# Patient Record
Sex: Female | Born: 1945 | Race: Black or African American | Hispanic: No | Marital: Married | Smoking: Never smoker
Health system: Southern US, Community
[De-identification: ages and names within clinical notes are randomized; demographics above are authoritative.]

## PROBLEM LIST (undated history)

## (undated) DIAGNOSIS — E119 Type 2 diabetes mellitus without complications: Secondary | ICD-10-CM

## (undated) HISTORY — PX: ABDOMINAL HYSTERECTOMY: SHX81

---

## 2013-07-24 ENCOUNTER — Other Ambulatory Visit: Payer: Self-pay | Admitting: Internal Medicine

## 2013-07-24 DIAGNOSIS — Z1231 Encounter for screening mammogram for malignant neoplasm of breast: Secondary | ICD-10-CM

## 2013-08-21 ENCOUNTER — Ambulatory Visit
Admission: RE | Admit: 2013-08-21 | Discharge: 2013-08-21 | Disposition: A | Discharge status: Home / Self Care | Payer: Medicare Other | Source: Ambulatory Visit | Attending: Internal Medicine | Admitting: Internal Medicine

## 2013-08-21 DIAGNOSIS — Z1231 Encounter for screening mammogram for malignant neoplasm of breast: Secondary | ICD-10-CM

## 2013-08-23 ENCOUNTER — Other Ambulatory Visit: Payer: Self-pay | Admitting: Internal Medicine

## 2013-08-23 DIAGNOSIS — R928 Other abnormal and inconclusive findings on diagnostic imaging of breast: Secondary | ICD-10-CM

## 2013-09-04 ENCOUNTER — Ambulatory Visit
Admission: RE | Admit: 2013-09-04 | Discharge: 2013-09-04 | Disposition: A | Discharge status: Home / Self Care | Payer: Medicare Other | Source: Ambulatory Visit | Attending: Internal Medicine | Admitting: Internal Medicine

## 2013-09-04 DIAGNOSIS — R928 Other abnormal and inconclusive findings on diagnostic imaging of breast: Secondary | ICD-10-CM

## 2013-09-09 ENCOUNTER — Ambulatory Visit
Admission: RE | Admit: 2013-09-09 | Discharge: 2013-09-09 | Disposition: A | Discharge status: Home / Self Care | Payer: Medicare Other | Source: Ambulatory Visit | Attending: Internal Medicine | Admitting: Internal Medicine

## 2013-09-09 ENCOUNTER — Other Ambulatory Visit: Payer: Self-pay | Admitting: Internal Medicine

## 2013-09-09 DIAGNOSIS — R079 Chest pain, unspecified: Secondary | ICD-10-CM

## 2014-03-31 ENCOUNTER — Other Ambulatory Visit: Payer: Self-pay | Admitting: Internal Medicine

## 2014-03-31 DIAGNOSIS — R51 Headache: Secondary | ICD-10-CM

## 2014-03-31 DIAGNOSIS — R42 Dizziness and giddiness: Secondary | ICD-10-CM

## 2014-04-05 ENCOUNTER — Ambulatory Visit
Admission: RE | Admit: 2014-04-05 | Discharge: 2014-04-05 | Disposition: A | Discharge status: Home / Self Care | Payer: Medicare Other | Source: Ambulatory Visit | Attending: Internal Medicine | Admitting: Internal Medicine

## 2014-04-05 DIAGNOSIS — R51 Headache: Secondary | ICD-10-CM

## 2014-04-05 DIAGNOSIS — R42 Dizziness and giddiness: Secondary | ICD-10-CM

## 2014-09-05 DIAGNOSIS — H6590 Unspecified nonsuppurative otitis media, unspecified ear: Secondary | ICD-10-CM | POA: Diagnosis not present

## 2014-09-05 DIAGNOSIS — H8110 Benign paroxysmal vertigo, unspecified ear: Secondary | ICD-10-CM | POA: Diagnosis not present

## 2014-09-19 DIAGNOSIS — E139 Other specified diabetes mellitus without complications: Secondary | ICD-10-CM | POA: Diagnosis not present

## 2015-01-27 DIAGNOSIS — M542 Cervicalgia: Secondary | ICD-10-CM | POA: Diagnosis not present

## 2015-04-01 DIAGNOSIS — I10 Essential (primary) hypertension: Secondary | ICD-10-CM | POA: Diagnosis not present

## 2015-04-01 DIAGNOSIS — Z0000 Encounter for general adult medical examination without abnormal findings: Secondary | ICD-10-CM | POA: Diagnosis not present

## 2015-04-01 DIAGNOSIS — E1165 Type 2 diabetes mellitus with hyperglycemia: Secondary | ICD-10-CM | POA: Diagnosis not present

## 2015-04-01 DIAGNOSIS — Z23 Encounter for immunization: Secondary | ICD-10-CM | POA: Diagnosis not present

## 2015-04-01 DIAGNOSIS — M109 Gout, unspecified: Secondary | ICD-10-CM | POA: Diagnosis not present

## 2015-04-01 DIAGNOSIS — Z1389 Encounter for screening for other disorder: Secondary | ICD-10-CM | POA: Diagnosis not present

## 2015-04-14 DIAGNOSIS — Z1211 Encounter for screening for malignant neoplasm of colon: Secondary | ICD-10-CM | POA: Diagnosis not present

## 2015-06-12 DIAGNOSIS — Z23 Encounter for immunization: Secondary | ICD-10-CM | POA: Diagnosis not present

## 2015-06-12 DIAGNOSIS — M25552 Pain in left hip: Secondary | ICD-10-CM | POA: Diagnosis not present

## 2015-08-21 DIAGNOSIS — M25552 Pain in left hip: Secondary | ICD-10-CM | POA: Diagnosis not present

## 2015-08-24 ENCOUNTER — Other Ambulatory Visit: Payer: Self-pay | Admitting: Internal Medicine

## 2015-08-24 ENCOUNTER — Ambulatory Visit
Admission: RE | Admit: 2015-08-24 | Discharge: 2015-08-24 | Disposition: A | Discharge status: Home / Self Care | Payer: Commercial Managed Care - HMO | Source: Ambulatory Visit | Attending: Internal Medicine | Admitting: Internal Medicine

## 2015-08-24 DIAGNOSIS — M25552 Pain in left hip: Secondary | ICD-10-CM

## 2015-08-24 DIAGNOSIS — M1612 Unilateral primary osteoarthritis, left hip: Secondary | ICD-10-CM | POA: Diagnosis not present

## 2015-09-04 DIAGNOSIS — M545 Low back pain: Secondary | ICD-10-CM | POA: Diagnosis not present

## 2015-09-04 DIAGNOSIS — M25552 Pain in left hip: Secondary | ICD-10-CM | POA: Diagnosis not present

## 2015-10-02 DIAGNOSIS — R920 Mammographic microcalcification found on diagnostic imaging of breast: Secondary | ICD-10-CM | POA: Diagnosis not present

## 2015-10-02 DIAGNOSIS — I10 Essential (primary) hypertension: Secondary | ICD-10-CM | POA: Diagnosis not present

## 2015-10-02 DIAGNOSIS — Z7984 Long term (current) use of oral hypoglycemic drugs: Secondary | ICD-10-CM | POA: Diagnosis not present

## 2015-10-02 DIAGNOSIS — E1165 Type 2 diabetes mellitus with hyperglycemia: Secondary | ICD-10-CM | POA: Diagnosis not present

## 2015-10-02 DIAGNOSIS — M109 Gout, unspecified: Secondary | ICD-10-CM | POA: Diagnosis not present

## 2015-10-23 DIAGNOSIS — H5213 Myopia, bilateral: Secondary | ICD-10-CM | POA: Diagnosis not present

## 2015-10-23 DIAGNOSIS — H18419 Arcus senilis, unspecified eye: Secondary | ICD-10-CM | POA: Diagnosis not present

## 2015-10-23 DIAGNOSIS — Z0100 Encounter for examination of eyes and vision without abnormal findings: Secondary | ICD-10-CM | POA: Diagnosis not present

## 2015-10-23 DIAGNOSIS — H5210 Myopia, unspecified eye: Secondary | ICD-10-CM | POA: Diagnosis not present

## 2015-10-23 DIAGNOSIS — E119 Type 2 diabetes mellitus without complications: Secondary | ICD-10-CM | POA: Diagnosis not present

## 2015-10-23 DIAGNOSIS — H52223 Regular astigmatism, bilateral: Secondary | ICD-10-CM | POA: Diagnosis not present

## 2015-10-23 DIAGNOSIS — H524 Presbyopia: Secondary | ICD-10-CM | POA: Diagnosis not present

## 2015-10-23 DIAGNOSIS — Z961 Presence of intraocular lens: Secondary | ICD-10-CM | POA: Diagnosis not present

## 2016-03-16 ENCOUNTER — Other Ambulatory Visit: Payer: Self-pay | Admitting: Internal Medicine

## 2016-03-16 DIAGNOSIS — R921 Mammographic calcification found on diagnostic imaging of breast: Secondary | ICD-10-CM

## 2016-03-25 ENCOUNTER — Ambulatory Visit
Admission: RE | Admit: 2016-03-25 | Discharge: 2016-03-25 | Disposition: A | Discharge status: Home / Self Care | Payer: Commercial Managed Care - HMO | Source: Ambulatory Visit | Attending: Internal Medicine | Admitting: Internal Medicine

## 2016-03-25 DIAGNOSIS — R921 Mammographic calcification found on diagnostic imaging of breast: Secondary | ICD-10-CM | POA: Diagnosis not present

## 2016-04-01 DIAGNOSIS — Z0000 Encounter for general adult medical examination without abnormal findings: Secondary | ICD-10-CM | POA: Diagnosis not present

## 2016-04-01 DIAGNOSIS — E1165 Type 2 diabetes mellitus with hyperglycemia: Secondary | ICD-10-CM | POA: Diagnosis not present

## 2016-04-01 DIAGNOSIS — Z7189 Other specified counseling: Secondary | ICD-10-CM | POA: Diagnosis not present

## 2016-04-01 DIAGNOSIS — Z1389 Encounter for screening for other disorder: Secondary | ICD-10-CM | POA: Diagnosis not present

## 2016-04-01 DIAGNOSIS — Z7984 Long term (current) use of oral hypoglycemic drugs: Secondary | ICD-10-CM | POA: Diagnosis not present

## 2016-04-15 DIAGNOSIS — I10 Essential (primary) hypertension: Secondary | ICD-10-CM | POA: Diagnosis not present

## 2016-04-15 DIAGNOSIS — Z7984 Long term (current) use of oral hypoglycemic drugs: Secondary | ICD-10-CM | POA: Diagnosis not present

## 2016-04-15 DIAGNOSIS — Q899 Congenital malformation, unspecified: Secondary | ICD-10-CM | POA: Diagnosis not present

## 2016-04-15 DIAGNOSIS — L989 Disorder of the skin and subcutaneous tissue, unspecified: Secondary | ICD-10-CM | POA: Diagnosis not present

## 2016-04-15 DIAGNOSIS — E1165 Type 2 diabetes mellitus with hyperglycemia: Secondary | ICD-10-CM | POA: Diagnosis not present

## 2016-05-13 DIAGNOSIS — L820 Inflamed seborrheic keratosis: Secondary | ICD-10-CM | POA: Diagnosis not present

## 2016-06-15 DIAGNOSIS — Z23 Encounter for immunization: Secondary | ICD-10-CM | POA: Diagnosis not present

## 2016-09-30 DIAGNOSIS — R0789 Other chest pain: Secondary | ICD-10-CM | POA: Diagnosis not present

## 2016-10-12 DIAGNOSIS — I10 Essential (primary) hypertension: Secondary | ICD-10-CM | POA: Diagnosis not present

## 2016-10-12 DIAGNOSIS — Z7984 Long term (current) use of oral hypoglycemic drugs: Secondary | ICD-10-CM | POA: Diagnosis not present

## 2016-10-12 DIAGNOSIS — M109 Gout, unspecified: Secondary | ICD-10-CM | POA: Diagnosis not present

## 2016-10-12 DIAGNOSIS — E1165 Type 2 diabetes mellitus with hyperglycemia: Secondary | ICD-10-CM | POA: Diagnosis not present

## 2016-10-12 DIAGNOSIS — R0789 Other chest pain: Secondary | ICD-10-CM | POA: Diagnosis not present

## 2017-01-17 DIAGNOSIS — E119 Type 2 diabetes mellitus without complications: Secondary | ICD-10-CM | POA: Diagnosis not present

## 2017-01-18 DIAGNOSIS — H524 Presbyopia: Secondary | ICD-10-CM | POA: Diagnosis not present

## 2017-01-25 ENCOUNTER — Emergency Department (HOSPITAL_COMMUNITY)
Admission: EM | Admit: 2017-01-25 | Discharge: 2017-01-26 | Disposition: A | Discharge status: Home / Self Care | Payer: Medicare HMO | Attending: Emergency Medicine | Admitting: Emergency Medicine

## 2017-01-25 ENCOUNTER — Encounter (HOSPITAL_COMMUNITY): Payer: Self-pay | Admitting: *Deleted

## 2017-01-25 DIAGNOSIS — E119 Type 2 diabetes mellitus without complications: Secondary | ICD-10-CM | POA: Insufficient documentation

## 2017-01-25 DIAGNOSIS — R109 Unspecified abdominal pain: Secondary | ICD-10-CM | POA: Diagnosis not present

## 2017-01-25 DIAGNOSIS — K5732 Diverticulitis of large intestine without perforation or abscess without bleeding: Secondary | ICD-10-CM | POA: Diagnosis not present

## 2017-01-25 DIAGNOSIS — K5792 Diverticulitis of intestine, part unspecified, without perforation or abscess without bleeding: Secondary | ICD-10-CM

## 2017-01-25 DIAGNOSIS — K5790 Diverticulosis of intestine, part unspecified, without perforation or abscess without bleeding: Secondary | ICD-10-CM | POA: Insufficient documentation

## 2017-01-25 DIAGNOSIS — R1030 Lower abdominal pain, unspecified: Secondary | ICD-10-CM | POA: Diagnosis present

## 2017-01-25 HISTORY — DX: Type 2 diabetes mellitus without complications: E11.9

## 2017-01-25 LAB — COMPREHENSIVE METABOLIC PANEL
ALT: 17 U/L (ref 14–54)
AST: 20 U/L (ref 15–41)
Albumin: 3.3 g/dL — ABNORMAL LOW (ref 3.5–5.0)
Alkaline Phosphatase: 65 U/L (ref 38–126)
Anion gap: 8 (ref 5–15)
BUN: 11 mg/dL (ref 6–20)
CO2: 25 mmol/L (ref 22–32)
Calcium: 8.6 mg/dL — ABNORMAL LOW (ref 8.9–10.3)
Chloride: 104 mmol/L (ref 101–111)
Creatinine, Ser: 0.98 mg/dL (ref 0.44–1.00)
GFR calc non Af Amer: 56 mL/min — ABNORMAL LOW (ref >60)
Glucose, Bld: 208 mg/dL — ABNORMAL HIGH (ref 65–99)
POTASSIUM: 3.8 mmol/L (ref 3.5–5.1)
SODIUM: 137 mmol/L (ref 135–145)
Total Bilirubin: 0.7 mg/dL (ref 0.3–1.2)
Total Protein: 7.1 g/dL (ref 6.5–8.1)

## 2017-01-25 LAB — CBC
HCT: 30.3 % — ABNORMAL LOW (ref 36.0–46.0)
Hemoglobin: 9.9 g/dL — ABNORMAL LOW (ref 12.0–15.0)
MCH: 19 pg — AB (ref 26.0–34.0)
MCHC: 32.7 g/dL (ref 30.0–36.0)
MCV: 58 fL — AB (ref 78.0–100.0)
PLATELETS: 167 10*3/uL (ref 150–400)
RBC: 5.22 MIL/uL — AB (ref 3.87–5.11)
RDW: 19.3 % — ABNORMAL HIGH (ref 11.5–15.5)
WBC: 13.4 10*3/uL — ABNORMAL HIGH (ref 4.0–10.5)

## 2017-01-25 LAB — LIPASE, BLOOD: LIPASE: 46 U/L (ref 11–51)

## 2017-01-25 NOTE — ED Provider Notes (Signed)
TIME SEEN: 11:58 PM  By signing my name below, I, Cynda AcresHailei Fulton, attest that this documentation has been prepared under the direction and in the presence of Estoria Geary, Layla MawKristen N, DO. Electronically Signed: Cynda AcresHailei Fulton, Scribe. 01/25/17. 12:03 AM.  CHIEF COMPLAINT: Abdominal pain   HPI:  Judy Vargas is a 71 y.o. female with a history of NIDDM, who presents to the Emergency Department complaining of sudden-onset, constant generalized abdominal pain that began 24 hours ago. Patient reports gradaully worsening abdominal pain since yesterday evening. Patient describes her pain as stabbing. Patient reports associated chills. No modifying factors indicated. Nothing improves or worsens her pain. Patient states her last bowel movement was three days ago, which is normal for her. Patient reports a surgical history of fibroid removal, C-section, and a hysterectomy. Patient denies any fever, nausea, vomiting, diarrhea, bloody stool or melena. No dysuria or hematuria. No vaginal bleeding or discharge.  ROS: See HPI Constitutional: no fever  Eyes: no drainage  ENT: no runny nose   Cardiovascular:  no chest pain  Resp: no SOB  GI: no vomiting GU: no dysuria Integumentary: no rash  Allergy: no hives  Musculoskeletal: no leg swelling  Neurological: no slurred speech ROS otherwise negative  PAST MEDICAL HISTORY/PAST SURGICAL HISTORY:  Past Medical History:  Diagnosis Date  . Diabetes mellitus without complication (HCC)     MEDICATIONS:  Prior to Admission medications   Not on File    ALLERGIES:  Allergies  Allergen Reactions  . Asa [Aspirin]   . Penicillins     SOCIAL HISTORY:  Social History  Substance Use Topics  . Smoking status: Never Smoker  . Smokeless tobacco: Never Used  . Alcohol use No    FAMILY HISTORY: No family history on file.  EXAM: BP (!) 146/67 (BP Location: Right Arm) Comment: Simultaneous filing. User may not have seen previous data.  Pulse (!) 101 Comment:  Simultaneous filing. User may not have seen previous data.  Temp 98.7 F (37.1 C) (Oral)   Resp 16   SpO2 96% Comment: Simultaneous filing. User may not have seen previous data. CONSTITUTIONAL: Alert and oriented and responds appropriately to questions. Well-appearing; well-nourished HEAD: Normocephalic EYES: Conjunctivae clear, pupils appear equal, EOMI ENT: normal nose; moist mucous membranes NECK: Supple, no meningismus, no nuchal rigidity, no LAD  CARD: RRR; S1 and S2 appreciated; no murmurs, no clicks, no rubs, no gallops RESP: Normal chest excursion without splinting or tachypnea; breath sounds clear and equal bilaterally; no wheezes, no rhonchi, no rales, no hypoxia or respiratory distress, speaking full sentences ABD/GI: Normal bowel sounds; non-distended; soft, tender to the RLQ with intermittent voluntary guarding, no rebound, no peritoneal signs, no hepatosplenomegaly BACK:  The back appears normal and is non-tender to palpation, there is no CVA tenderness EXT: Normal ROM in all joints; non-tender to palpation; no edema; normal capillary refill; no cyanosis, no calf tenderness or swelling    SKIN: Normal color for age and race; warm; no rash NEURO: Moves all extremities equally PSYCH: The patient's mood and manner are appropriate. Grooming and personal hygiene are appropriate.  MEDICAL DECISION MAKING: Patient here with lower abdominal pain. Is tender in the right lower quadrant.  Nonsurgical abdomen. Differential diagnosis includes appendicitis, colitis, diverticulitis, UTI, bowel obstruction. We'll obtain labs, urine and a CT of the abdomen and pelvis.  ED PROGRESS: Labs show leukocytosis with left shift. CT scan shows acute diverticulitis without perforation or abscess. Will give dose of IV antibiotics here. Pain is been well controlled  with just 1 dose of IV pain medication. She is not vomiting again is not having bloody stools or melena. She would like to be discharged with oral  antibiotics. Urine pending.   Patient's urine is unremarkable. She still is feeling well and is ready for discharge. Has received IV Flagyl and Cipro. We'll discharge with prescriptions for the same for the next 10 days and have recommend close PCP follow-up. Discussed at length return precautions with patient and her husband. We'll discharge with pain and nausea medicine. They're comfortable with this plan.   At this time, I do not feel there is any life-threatening condition present. I have reviewed and discussed all results (EKG, imaging, lab, urine as appropriate) and exam findings with patient/family. I have reviewed nursing notes and appropriate previous records.  I feel the patient is safe to be discharged home without further emergent workup and can continue workup as an outpatient as needed. Discussed usual and customary return precautions. Patient/family verbalize understanding and are comfortable with this plan.  Outpatient follow-up has been provided if needed. All questions have been answered.   I personally performed the services described in this documentation, which was scribed in my presence. The recorded information has been reviewed and is accurate.    Madoline Bhatt, Layla Maw, DO 01/26/17 [phone]

## 2017-01-25 NOTE — ED Triage Notes (Signed)
Pt c/o generalized abdominal pain since 8pm last night. Denies NVD

## 2017-01-26 ENCOUNTER — Emergency Department (HOSPITAL_COMMUNITY): Payer: Medicare HMO

## 2017-01-26 DIAGNOSIS — R109 Unspecified abdominal pain: Secondary | ICD-10-CM | POA: Diagnosis not present

## 2017-01-26 LAB — DIFFERENTIAL
Basophils Absolute: 0 10*3/uL (ref 0.0–0.1)
Basophils Relative: 0 %
EOS PCT: 0 %
Eosinophils Absolute: 0 10*3/uL (ref 0.0–0.7)
LYMPHS PCT: 19 %
Lymphs Abs: 2.6 10*3/uL (ref 0.7–4.0)
MONOS PCT: 4 %
Monocytes Absolute: 0.5 10*3/uL (ref 0.1–1.0)
NEUTROS PCT: 77 %
Neutro Abs: 10 10*3/uL — ABNORMAL HIGH (ref 1.7–7.7)

## 2017-01-26 LAB — URINALYSIS, ROUTINE W REFLEX MICROSCOPIC
BILIRUBIN URINE: NEGATIVE
GLUCOSE, UA: NEGATIVE mg/dL
HGB URINE DIPSTICK: NEGATIVE
Ketones, ur: NEGATIVE mg/dL
Leukocytes, UA: NEGATIVE
Nitrite: NEGATIVE
PH: 6 (ref 5.0–8.0)
Protein, ur: NEGATIVE mg/dL
SPECIFIC GRAVITY, URINE: 1.023 (ref 1.005–1.030)

## 2017-01-26 MED ORDER — HYDROCODONE-ACETAMINOPHEN 5-325 MG PO TABS: 1.0000 | ORAL_TABLET | Freq: Four times a day (QID) | ORAL | 0 refills | Status: AC | PRN

## 2017-01-26 MED ORDER — ONDANSETRON 4 MG PO TBDP: 4.0000 mg | ORAL_TABLET | Freq: Three times a day (TID) | ORAL | 0 refills | Status: AC | PRN

## 2017-01-26 MED ORDER — METRONIDAZOLE 500 MG PO TABS: 500.0000 mg | ORAL_TABLET | Freq: Three times a day (TID) | ORAL | 0 refills | Status: AC

## 2017-01-26 MED ORDER — CIPROFLOXACIN IN D5W 400 MG/200ML IV SOLN
400.0000 mg | Freq: Once | INTRAVENOUS | Status: AC
  Administered 2017-01-26: 400 mg via INTRAVENOUS
  Filled 2017-01-26: qty 200

## 2017-01-26 MED ORDER — FENTANYL CITRATE (PF) 100 MCG/2ML IJ SOLN
50.0000 ug | Freq: Once | INTRAMUSCULAR | Status: AC
  Administered 2017-01-26: 50 ug via INTRAVENOUS
  Filled 2017-01-26: qty 2

## 2017-01-26 MED ORDER — METRONIDAZOLE IN NACL 5-0.79 MG/ML-% IV SOLN
500.0000 mg | Freq: Once | INTRAVENOUS | Status: AC
  Administered 2017-01-26: 500 mg via INTRAVENOUS
  Filled 2017-01-26: qty 100

## 2017-01-26 MED ORDER — ONDANSETRON HCL 4 MG/2ML IJ SOLN
4.0000 mg | Freq: Once | INTRAMUSCULAR | Status: AC
  Administered 2017-01-26: 4 mg via INTRAVENOUS
  Filled 2017-01-26: qty 2

## 2017-01-26 MED ORDER — SODIUM CHLORIDE 0.9 % IV SOLN
INTRAVENOUS | Status: DC
  Administered 2017-01-26: 01:00:00 via INTRAVENOUS

## 2017-01-26 MED ORDER — IOPAMIDOL (ISOVUE-300) INJECTION 61%
INTRAVENOUS | Status: AC
  Administered 2017-01-26: 100 mL
  Filled 2017-01-26: qty 100

## 2017-01-26 MED ORDER — CIPROFLOXACIN HCL 500 MG PO TABS
500.0000 mg | ORAL_TABLET | Freq: Two times a day (BID) | ORAL | 0 refills | Status: AC
Start: 2017-01-26 — End: ?

## 2017-01-26 NOTE — Discharge Instructions (Signed)
To find a primary care or specialty doctor please call [phone] or [phone] to access "[facility] Find a Doctor Service." ° °You may also go on the [facility] website at [email or web address] ° °There are also multiple [facility], [facility], [facility] and [facility] practices throughout the [place] that are frequently accepting new patients. You may find a clinic that is close to your home and contact them. ° ° [facility] -  °[address] °[place] [address] °[phone] ° ° °[facility] -  °[address] °[place] [address] [phone] ° ° °[facility] - °[address]  °[place] [address] [phone] ° ° °

## 2017-01-26 NOTE — ED Notes (Signed)
Patient states she is feeling better.

## 2017-02-02 DIAGNOSIS — H9202 Otalgia, left ear: Secondary | ICD-10-CM | POA: Diagnosis not present

## 2017-02-02 DIAGNOSIS — E1165 Type 2 diabetes mellitus with hyperglycemia: Secondary | ICD-10-CM | POA: Diagnosis not present

## 2017-02-02 DIAGNOSIS — Z7984 Long term (current) use of oral hypoglycemic drugs: Secondary | ICD-10-CM | POA: Diagnosis not present

## 2017-02-02 DIAGNOSIS — L309 Dermatitis, unspecified: Secondary | ICD-10-CM | POA: Diagnosis not present

## 2017-02-02 DIAGNOSIS — K5792 Diverticulitis of intestine, part unspecified, without perforation or abscess without bleeding: Secondary | ICD-10-CM | POA: Diagnosis not present

## 2017-04-13 DIAGNOSIS — Z0000 Encounter for general adult medical examination without abnormal findings: Secondary | ICD-10-CM | POA: Diagnosis not present

## 2017-04-13 DIAGNOSIS — Z7189 Other specified counseling: Secondary | ICD-10-CM | POA: Diagnosis not present

## 2017-04-13 DIAGNOSIS — Z1389 Encounter for screening for other disorder: Secondary | ICD-10-CM | POA: Diagnosis not present

## 2017-06-01 DIAGNOSIS — Z23 Encounter for immunization: Secondary | ICD-10-CM | POA: Diagnosis not present

## 2017-10-27 DIAGNOSIS — M109 Gout, unspecified: Secondary | ICD-10-CM | POA: Diagnosis not present

## 2017-10-27 DIAGNOSIS — I10 Essential (primary) hypertension: Secondary | ICD-10-CM | POA: Diagnosis not present

## 2017-10-27 DIAGNOSIS — M2550 Pain in unspecified joint: Secondary | ICD-10-CM | POA: Diagnosis not present

## 2017-10-27 DIAGNOSIS — E1165 Type 2 diabetes mellitus with hyperglycemia: Secondary | ICD-10-CM | POA: Diagnosis not present

## 2017-10-27 DIAGNOSIS — K219 Gastro-esophageal reflux disease without esophagitis: Secondary | ICD-10-CM | POA: Diagnosis not present

## 2017-11-02 DIAGNOSIS — E1165 Type 2 diabetes mellitus with hyperglycemia: Secondary | ICD-10-CM | POA: Diagnosis not present

## 2017-11-02 DIAGNOSIS — Z7984 Long term (current) use of oral hypoglycemic drugs: Secondary | ICD-10-CM | POA: Diagnosis not present

## 2017-11-02 DIAGNOSIS — I10 Essential (primary) hypertension: Secondary | ICD-10-CM | POA: Diagnosis not present

## 2017-11-23 ENCOUNTER — Encounter (HOSPITAL_COMMUNITY): Payer: Self-pay

## 2017-11-23 ENCOUNTER — Other Ambulatory Visit: Payer: Self-pay

## 2017-11-23 ENCOUNTER — Emergency Department (HOSPITAL_COMMUNITY): Payer: Medicare HMO

## 2017-11-23 ENCOUNTER — Emergency Department (HOSPITAL_COMMUNITY)
Admission: EM | Admit: 2017-11-23 | Discharge: 2017-11-24 | Disposition: A | Discharge status: Home / Self Care | Payer: Medicare HMO | Attending: Emergency Medicine | Admitting: Emergency Medicine

## 2017-11-23 DIAGNOSIS — R0981 Nasal congestion: Secondary | ICD-10-CM | POA: Diagnosis not present

## 2017-11-23 DIAGNOSIS — R270 Ataxia, unspecified: Secondary | ICD-10-CM | POA: Diagnosis not present

## 2017-11-23 DIAGNOSIS — H6692 Otitis media, unspecified, left ear: Secondary | ICD-10-CM | POA: Diagnosis not present

## 2017-11-23 DIAGNOSIS — R112 Nausea with vomiting, unspecified: Secondary | ICD-10-CM | POA: Insufficient documentation

## 2017-11-23 DIAGNOSIS — Z79899 Other long term (current) drug therapy: Secondary | ICD-10-CM | POA: Insufficient documentation

## 2017-11-23 DIAGNOSIS — E119 Type 2 diabetes mellitus without complications: Secondary | ICD-10-CM | POA: Insufficient documentation

## 2017-11-23 DIAGNOSIS — H938X3 Other specified disorders of ear, bilateral: Secondary | ICD-10-CM | POA: Insufficient documentation

## 2017-11-23 DIAGNOSIS — R42 Dizziness and giddiness: Secondary | ICD-10-CM | POA: Diagnosis not present

## 2017-11-23 DIAGNOSIS — H9202 Otalgia, left ear: Secondary | ICD-10-CM | POA: Diagnosis not present

## 2017-11-23 LAB — CBC WITH DIFFERENTIAL/PLATELET
Basophils Absolute: 0 10*3/uL (ref 0.0–0.1)
Basophils Relative: 0 %
Eosinophils Absolute: 0.1 10*3/uL (ref 0.0–0.7)
Eosinophils Relative: 1 %
HCT: 31 % — ABNORMAL LOW (ref 36.0–46.0)
Hemoglobin: 10 g/dL — ABNORMAL LOW (ref 12.0–15.0)
Lymphocytes Relative: 27 %
Lymphs Abs: 1.9 10*3/uL (ref 0.7–4.0)
MCH: 19.3 pg — ABNORMAL LOW (ref 26.0–34.0)
MCHC: 32.3 g/dL (ref 30.0–36.0)
MCV: 59.8 fL — ABNORMAL LOW (ref 78.0–100.0)
Monocytes Absolute: 0.3 10*3/uL (ref 0.1–1.0)
Monocytes Relative: 4 %
Neutro Abs: 4.7 10*3/uL (ref 1.7–7.7)
Neutrophils Relative %: 68 %
Platelets: 159 10*3/uL (ref 150–400)
RBC: 5.18 MIL/uL — ABNORMAL HIGH (ref 3.87–5.11)
RDW: 19.3 % — ABNORMAL HIGH (ref 11.5–15.5)
WBC: 7 10*3/uL (ref 4.0–10.5)

## 2017-11-23 LAB — URINALYSIS, ROUTINE W REFLEX MICROSCOPIC
Bilirubin Urine: NEGATIVE
Glucose, UA: NEGATIVE mg/dL
Hgb urine dipstick: NEGATIVE
Ketones, ur: NEGATIVE mg/dL
Leukocytes, UA: NEGATIVE
Nitrite: NEGATIVE
Protein, ur: NEGATIVE mg/dL
Specific Gravity, Urine: 1.009 (ref 1.005–1.030)
pH: 8 (ref 5.0–8.0)

## 2017-11-23 LAB — BASIC METABOLIC PANEL
Anion gap: 8 (ref 5–15)
BUN: 11 mg/dL (ref 6–20)
CO2: 27 mmol/L (ref 22–32)
Calcium: 9.1 mg/dL (ref 8.9–10.3)
Chloride: 108 mmol/L (ref 101–111)
Creatinine, Ser: 0.79 mg/dL (ref 0.44–1.00)
GFR calc Af Amer: >60 mL/min (ref >60)
GFR calc non Af Amer: >60 mL/min (ref >60)
Glucose, Bld: 168 mg/dL — ABNORMAL HIGH (ref 65–99)
Potassium: 3.9 mmol/L (ref 3.5–5.1)
Sodium: 143 mmol/L (ref 135–145)

## 2017-11-23 MED ORDER — ONDANSETRON HCL 4 MG/2ML IJ SOLN
4.0000 mg | Freq: Once | INTRAMUSCULAR | Status: AC
  Administered 2017-11-23: 4 mg via INTRAVENOUS
  Filled 2017-11-23: qty 2

## 2017-11-23 MED ORDER — SODIUM CHLORIDE 0.9 % IV BOLUS
500.0000 mL | Freq: Once | INTRAVENOUS | Status: AC
  Administered 2017-11-23: 500 mL via INTRAVENOUS

## 2017-11-23 MED ORDER — LORAZEPAM 2 MG/ML IJ SOLN
0.5000 mg | Freq: Once | INTRAMUSCULAR | Status: AC
Start: 2017-11-23 — End: 2017-11-23
  Administered 2017-11-23: 0.5 mg via INTRAVENOUS
  Filled 2017-11-23 (×2): qty 1

## 2017-11-23 NOTE — ED Triage Notes (Addendum)
PT presents to ED with dizziness and vomiting. She states " I feel like the whole room is spinning around me". Pt reports she was fine when she went to bed last hight but noticed this at 0700 when going to bathroom upon waking. No other neuro deficit. Pt endorses recent nasal congestion/ cough. No hx of vertigo. Speech normal

## 2017-11-23 NOTE — ED Notes (Signed)
Patient transported to MRI 

## 2017-11-23 NOTE — ED Provider Notes (Signed)
Assumed care from Dr. Ranae PalmsYelverton at shift change.  See prior note for full H&P.  Briefly, 72 y.o. F here with vertigo type symptoms.  Has also has some sinus pressure, congestion, and left ear pain.  Work-up thus far reassuring.  Plan:  MRI pending to assess for acute stroke.  If negative, home with augmentin and meclizine.  Results for orders placed or performed during the hospital encounter of 11/23/17  CBC with Differential  Result Value Ref Range   WBC 7.0 4.0 - 10.5 K/uL   RBC 5.18 (H) 3.87 - 5.11 MIL/uL   Hemoglobin 10.0 (L) 12.0 - 15.0 g/dL   HCT 96.031.0 (L) 45.436.0 - 09.846.0 %   MCV 59.8 (L) 78.0 - 100.0 fL   MCH 19.3 (L) 26.0 - 34.0 pg   MCHC 32.3 30.0 - 36.0 g/dL   RDW 11.919.3 (H) 14.711.5 - 82.915.5 %   Platelets 159 150 - 400 K/uL   Neutrophils Relative % 68 %   Lymphocytes Relative 27 %   Monocytes Relative 4 %   Eosinophils Relative 1 %   Basophils Relative 0 %   Neutro Abs 4.7 1.7 - 7.7 K/uL   Lymphs Abs 1.9 0.7 - 4.0 K/uL   Monocytes Absolute 0.3 0.1 - 1.0 K/uL   Eosinophils Absolute 0.1 0.0 - 0.7 K/uL   Basophils Absolute 0.0 0.0 - 0.1 K/uL   RBC Morphology TARGET CELLS   Basic metabolic panel  Result Value Ref Range   Sodium 143 135 - 145 mmol/L   Potassium 3.9 3.5 - 5.1 mmol/L   Chloride 108 101 - 111 mmol/L   CO2 27 22 - 32 mmol/L   Glucose, Bld 168 (H) 65 - 99 mg/dL   BUN 11 6 - 20 mg/dL   Creatinine, Ser 5.620.79 0.44 - 1.00 mg/dL   Calcium 9.1 8.9 - 13.010.3 mg/dL   GFR calc non Af Amer >60 >60 mL/min   GFR calc Af Amer >60 >60 mL/min   Anion gap 8 5 - 15  Urinalysis, Routine w reflex microscopic  Result Value Ref Range   Color, Urine STRAW (A) YELLOW   APPearance CLEAR CLEAR   Specific Gravity, Urine 1.009 1.005 - 1.030   pH 8.0 5.0 - 8.0   Glucose, UA NEGATIVE NEGATIVE mg/dL   Hgb urine dipstick NEGATIVE NEGATIVE   Bilirubin Urine NEGATIVE NEGATIVE   Ketones, ur NEGATIVE NEGATIVE mg/dL   Protein, ur NEGATIVE NEGATIVE mg/dL   Nitrite NEGATIVE NEGATIVE   Leukocytes, UA  NEGATIVE NEGATIVE   Ct Head Wo Contrast  Result Date: 11/23/2017 CLINICAL DATA:  Dizziness for several hours EXAM: CT HEAD WITHOUT CONTRAST TECHNIQUE: Contiguous axial images were obtained from the base of the skull through the vertex without intravenous contrast. COMPARISON:  04/05/2014 FINDINGS: Brain: No evidence of acute infarction, hemorrhage, hydrocephalus, extra-axial collection or mass lesion/mass effect. Vascular: No hyperdense vessel or unexpected calcification. Skull: Normal. Negative for fracture or focal lesion. Sinuses/Orbits: No acute finding. Other: None. IMPRESSION: No acute intracranial abnormality noted. Electronically Signed   By: Alcide CleverMark  Lukens M.D.   On: 11/23/2017 13:24   Mr Brain Wo Contrast  Result Date: 11/23/2017 CLINICAL DATA:  Initial evaluation for acute ataxia, stroke suspected. EXAM: MRI HEAD WITHOUT CONTRAST TECHNIQUE: Multiplanar, multiecho pulse sequences of the brain and surrounding structures were obtained without intravenous contrast. COMPARISON:  Prior CT from earlier the same day. FINDINGS: Brain: Cerebral volume within normal limits for age. Minimal patchy T2/FLAIR hyperintensity within the periventricular and deep white matter  both cerebral hemispheres, nonspecific, but most like related to chronic small vessel ischemic changes, felt to be within normal limits for age. No abnormal foci of restricted diffusion to suggest acute or subacute ischemia. Gray-white matter differentiation maintained. No encephalomalacia to suggest chronic infarction. No foci of susceptibility artifact to suggest acute or chronic intracranial hemorrhage. No mass lesion, midline shift or mass effect. No hydrocephalus. No extra-axial fluid collection. Major dural sinuses are grossly patent. Pituitary gland suprasellar region normal. Midline structures intact and normal. Vascular: Major intracranial vascular flow voids are well maintained. Skull and upper cervical spine: Craniocervical junction  within normal limits. Upper cervical spine normal. Bone marrow signal intensity within normal limits. No scalp soft tissue abnormality. Sinuses/Orbits: Globes and orbital soft tissues within normal limits. Patient status post lens extraction bilaterally. Paranasal sinuses are largely clear. No mastoid effusion. Inner ear structures normal. Other: None. IMPRESSION: Normal brain MRI for age. No acute intracranial abnormality identified. Electronically Signed   By: Rise Mu M.D.   On: 11/23/2017 23:46    MRI negative for acute stroke.  Will d/c home with meclizine and omnicef (penicillin allergy).  Close follow-up with PCP.  Discussed plan with patient, she acknowledged understanding and agreed with plan of care.  Return precautions given for new or worsening symptoms.   Garlon Hatchet, PA-C 11/24/17 0019    Raeford Razor, MD 11/25/17 1012

## 2017-11-23 NOTE — ED Provider Notes (Addendum)
[facility] EMERGENCY DEPARTMENT Provider Note   CSN: [id number] Arrival date & time: 11/23/17  1219     History   Chief Complaint Chief Complaint  Patient presents with  . Dizziness  . Emesis    HPI Judy Vargas is a 72 y.o. female.  HPI Patient presents with several weeks of nasal congestion, sinus pressure and left ear pain.  States she woke this morning at 7 AM and had room spinning sensation, nausea, vomiting and unsteady gait.  She denied any speech or visual changes.  She denied focal weakness or numbness.  She states the spinning sensation is worse with movement.  No previous history of vertigo.  No fever or chills.  Past Medical History:  Diagnosis Date  . Diabetes mellitus without complication (HCC)     There are no active problems to display for this patient.   Past Surgical History:  Procedure Laterality Date  . ABDOMINAL HYSTERECTOMY    . CESAREAN SECTION       OB History   None      Home Medications    Prior to Admission medications   Medication Sig Start Date End Date Taking? Authorizing Provider  acetaminophen (TYLENOL) 500 MG tablet Take 500 mg by mouth every 6 (six) hours as needed for mild pain.   Yes [provider]  allopurinol (ZYLOPRIM) 100 MG tablet Take 100 mg by mouth daily.   Yes [provider]  CINNAMON PO Take 1 tablet by mouth daily.   Yes [provider]  glipiZIDE (GLUCOTROL) 10 MG tablet Take 10 mg by mouth every evening.   Yes [provider]  ranitidine (ZANTAC) 150 MG tablet Take 150 mg by mouth daily as needed for heartburn.   Yes [provider]  cefdinir (OMNICEF) 300 MG capsule Take 1 capsule (300 mg total) by mouth 2 (two) times daily. 11/24/17   Garlon Hatchet, PA-C  ciprofloxacin (CIPRO) 500 MG tablet Take 1 tablet (500 mg total) by mouth 2 (two) times daily. Patient not taking: Reported on 11/23/2017 01/26/17   Ward, Layla Maw, DO    HYDROcodone-acetaminophen (NORCO/VICODIN) 5-325 MG tablet Take 1-2 tablets by mouth every 6 (six) hours as needed. Patient not taking: Reported on 11/23/2017 01/26/17   Ward, Layla Maw, DO  meclizine (ANTIVERT) 25 MG tablet Take 1 tablet (25 mg total) by mouth 3 (three) times daily as needed for dizziness. 11/24/17   Garlon Hatchet, PA-C  metroNIDAZOLE (FLAGYL) 500 MG tablet Take 1 tablet (500 mg total) by mouth 3 (three) times daily. Patient not taking: Reported on 11/23/2017 01/26/17   Ward, Layla Maw, DO  ondansetron (ZOFRAN ODT) 4 MG disintegrating tablet Take 1 tablet (4 mg total) by mouth every 8 (eight) hours as needed for nausea or vomiting. Patient not taking: Reported on 11/23/2017 01/26/17   Ward, Layla Maw, DO    Family History No family history on file.  Social History Social History   Tobacco Use  . Smoking status: Never Smoker  . Smokeless tobacco: Never Used  Substance Use Topics  . Alcohol use: No  . Drug use: No     Allergies   Asa [aspirin] and Penicillins   Review of Systems Review of Systems  Constitutional: Negative for chills and fever.  HENT: Positive for congestion, ear pain and sinus pressure. Negative for hearing loss, sore throat and tinnitus.   Eyes: Negative for visual disturbance.  Respiratory: Negative for cough, shortness of breath and wheezing.  Cardiovascular: Negative for chest pain, palpitations and leg swelling.  Gastrointestinal: Positive for nausea and vomiting. Negative for abdominal pain, blood in stool, constipation and diarrhea.  Genitourinary: Negative for dysuria, flank pain and frequency.  Musculoskeletal: Positive for gait problem. Negative for arthralgias, back pain, myalgias and neck pain.  Skin: Negative for rash and wound.  Neurological: Positive for dizziness. Negative for speech difficulty, weakness, light-headedness, numbness and headaches.  All other systems reviewed and are negative.    Physical Exam Updated Vital  Signs BP (!) 168/78   Pulse 100   Temp 97.6 F (36.4 C) (Oral)   Resp 16   SpO2 100%   Physical Exam  Constitutional: She is oriented to person, place, and time. She appears well-developed and well-nourished. No distress.  HENT:  Head: Normocephalic and atraumatic.  Mouth/Throat: Oropharynx is clear and moist. No oropharyngeal exudate.  Left TM is erythematous and bulging.  Oropharynx is clear.  Mild nasal mucosal edema.  Eyes: Pupils are equal, round, and reactive to light. EOM are normal.  Few beats of fatigable horizontal nystagmus.  Neck: Normal range of motion. Neck supple. No JVD present.  Cardiovascular: Normal rate and regular rhythm. Exam reveals no gallop and no friction rub.  No murmur heard. Pulmonary/Chest: Effort normal and breath sounds normal.  Abdominal: Soft. Bowel sounds are normal. There is no tenderness. There is no rebound and no guarding.  Musculoskeletal: Normal range of motion. She exhibits no edema or tenderness.  Lymphadenopathy:    She has no cervical adenopathy.  Neurological: She is alert and oriented to person, place, and time.  Patient is alert and oriented x3 with clear, goal oriented speech. Patient has 5/5 motor in all extremities. Sensation is intact to light touch. Bilateral finger-to-nose is normal with no signs of dysmetria.   Skin: Skin is warm and dry. Capillary refill takes less than 2 seconds. No rash noted. She is not diaphoretic. No erythema.  Psychiatric: She has a normal mood and affect. Her behavior is normal.  Nursing note and vitals reviewed.    ED Treatments / Results  Labs (all labs ordered are listed, but only abnormal results are displayed) Labs Reviewed  CBC WITH DIFFERENTIAL/PLATELET - Abnormal; Notable for the following components:      Result Value   RBC 5.18 (*)    Hemoglobin 10.0 (*)    HCT 31.0 (*)    MCV 59.8 (*)    MCH 19.3 (*)    RDW 19.3 (*)    All other components within normal limits  BASIC METABOLIC PANEL  - Abnormal; Notable for the following components:   Glucose, Bld 168 (*)    All other components within normal limits  URINALYSIS, ROUTINE W REFLEX MICROSCOPIC - Abnormal; Notable for the following components:   Color, Urine STRAW (*)    All other components within normal limits    EKG None  Radiology No results found.  Procedures Procedures (including critical care time)  Medications Ordered in ED Medications  sodium chloride 0.9 % bolus 500 mL (0 mLs Intravenous Stopped 11/24/17 0136)  LORazepam (ATIVAN) injection 0.5 mg (0.5 mg Intravenous Given 11/23/17 2240)  ondansetron (ZOFRAN) injection 4 mg (4 mg Intravenous Given 11/23/17 2239)     Initial Impression / Assessment and Plan / ED Course  I have reviewed the triage vital signs and the nursing notes.  Pertinent labs & imaging results that were available during my care of the patient were reviewed by me and considered in my medical  decision making (see chart for details).    CT head without acute findings.  Suspect patient is having peripheral vertigo due to left ear infection however given age we will get an MRI to rule out posterior fossa infarct.   Signed out to oncoming emergency provider pending MRI brain.  If negative, DC home with treatment for acute otitis media and peripheral vertigo.  Final Clinical Impressions(s) / ED Diagnoses   Final diagnoses:  Dizziness  Congestion of both ears    ED Discharge Orders        Ordered    meclizine (ANTIVERT) 25 MG tablet  3 times daily PRN     11/24/17 0018    cefdinir (OMNICEF) 300 MG capsule  2 times daily     11/24/17 0018       Loren RacerYelverton, Edword Cu, MD 11/27/17 1412    Loren RacerYelverton, Chrysten Woulfe, MD 12/08/17 1540

## 2017-11-24 MED ORDER — MECLIZINE HCL 25 MG PO TABS
25.0000 mg | ORAL_TABLET | Freq: Three times a day (TID) | ORAL | 0 refills | Status: AC | PRN
Start: 2017-11-24 — End: ?

## 2017-11-24 MED ORDER — CEFDINIR 300 MG PO CAPS: 300.0000 mg | ORAL_CAPSULE | Freq: Two times a day (BID) | ORAL | 0 refills | Status: AC

## 2017-11-24 NOTE — Discharge Instructions (Signed)
Take the prescribed medication as directed. °Follow-up with your primary care doctor. °Return to the ED for new or worsening symptoms. °

## 2017-11-30 DIAGNOSIS — R42 Dizziness and giddiness: Secondary | ICD-10-CM | POA: Diagnosis not present

## 2018-04-23 DIAGNOSIS — E1169 Type 2 diabetes mellitus with other specified complication: Secondary | ICD-10-CM | POA: Diagnosis not present

## 2018-04-23 DIAGNOSIS — H18419 Arcus senilis, unspecified eye: Secondary | ICD-10-CM | POA: Diagnosis not present

## 2018-04-23 DIAGNOSIS — Z0000 Encounter for general adult medical examination without abnormal findings: Secondary | ICD-10-CM | POA: Diagnosis not present

## 2018-04-23 DIAGNOSIS — D649 Anemia, unspecified: Secondary | ICD-10-CM | POA: Diagnosis not present

## 2018-04-23 DIAGNOSIS — Z1389 Encounter for screening for other disorder: Secondary | ICD-10-CM | POA: Diagnosis not present

## 2018-04-23 DIAGNOSIS — I10 Essential (primary) hypertension: Secondary | ICD-10-CM | POA: Diagnosis not present

## 2018-04-23 DIAGNOSIS — H524 Presbyopia: Secondary | ICD-10-CM | POA: Diagnosis not present

## 2018-04-23 DIAGNOSIS — H52223 Regular astigmatism, bilateral: Secondary | ICD-10-CM | POA: Diagnosis not present

## 2018-04-23 DIAGNOSIS — R5383 Other fatigue: Secondary | ICD-10-CM | POA: Diagnosis not present

## 2018-04-23 DIAGNOSIS — Z23 Encounter for immunization: Secondary | ICD-10-CM | POA: Diagnosis not present

## 2018-04-23 DIAGNOSIS — Z7984 Long term (current) use of oral hypoglycemic drugs: Secondary | ICD-10-CM | POA: Diagnosis not present

## 2018-04-23 DIAGNOSIS — E119 Type 2 diabetes mellitus without complications: Secondary | ICD-10-CM | POA: Diagnosis not present

## 2018-04-23 DIAGNOSIS — H5213 Myopia, bilateral: Secondary | ICD-10-CM | POA: Diagnosis not present

## 2018-04-23 DIAGNOSIS — Z961 Presence of intraocular lens: Secondary | ICD-10-CM | POA: Diagnosis not present

## 2018-05-02 DIAGNOSIS — D649 Anemia, unspecified: Secondary | ICD-10-CM | POA: Diagnosis not present

## 2018-08-30 DIAGNOSIS — R42 Dizziness and giddiness: Secondary | ICD-10-CM | POA: Diagnosis not present

## 2018-09-26 IMAGING — MR MR HEAD W/O CM
10 of 11 series · 42 of 48 positions shown · non-contrast
Comparison: Prior CT from earlier the same day.

CLINICAL DATA: Initial evaluation for acute ataxia, stroke
suspected.

EXAM:
MRI HEAD WITHOUT CONTRAST
TECHNIQUE: Multiplanar, multiecho pulse sequences of the brain and surrounding
structures were obtained without intravenous contrast.

[Series 5001: ax dwi_tracew · axial · 3.0mm · 1.50mm/px · z∈[-107,+5]mm · 9 of 84 slices shown]
[im 1/84]
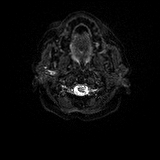
[im 11/84]
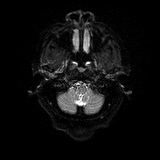
[im 21/84]
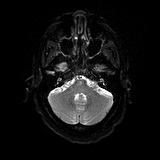
[im 32/84]
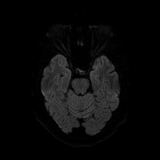
[im 42/84]
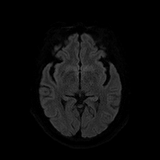
[im 52/84]
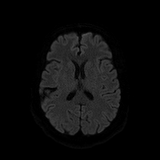
[im 63/84]
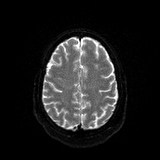
[im 73/84]
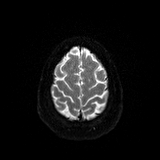
[im 84/84]
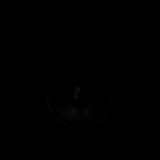

[Series 6001: ax dwi_adc · axial · 3.0mm · 1.50mm/px · z∈[-107,+5]mm · 4 of 42 slices shown]
[im 1/42]
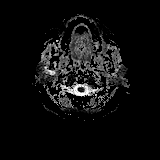
[im 14/42]
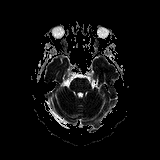
[im 28/42]
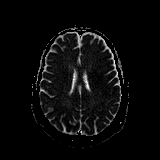
[im 42/42]
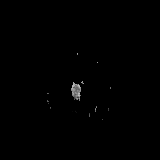

[Series 7001: cor dwi_tracew · coronal · 5.0mm · 1.44mm/px · 6 of 64 slices shown]
[im 1/64]
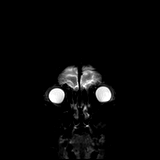
[im 13/64]
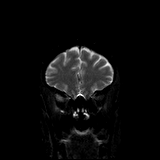
[im 26/64]
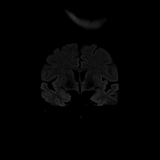
[im 38/64]
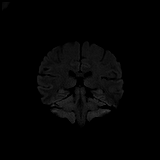
[im 51/64]
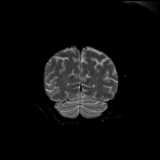
[im 64/64]
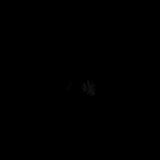

[Series 8001: cor dwi_adc · coronal · 5.0mm · 1.44mm/px · 3 of 32 slices shown]
[im 1/32]
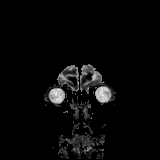
[im 16/32]
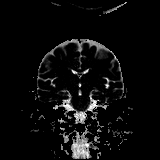
[im 32/32]
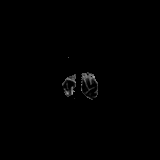

[Series 9001: T1 · sagittal · 5.0mm · 0.75mm/px · 2 of 23 slices shown]
[im 1/23]
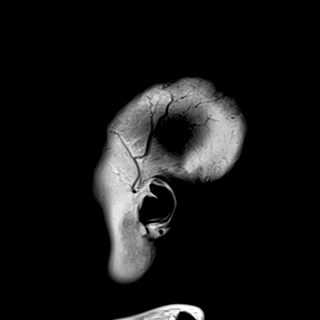
[im 23/23]
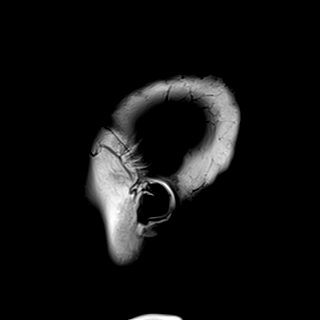

[T2 · axial · 5.0mm · 0.69mm/px · z∈[-105,+15]mm · 2 of 25 slices shown (1 of 2)]
[im 1/25]
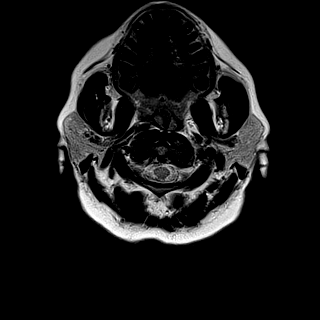
[im 25/25]
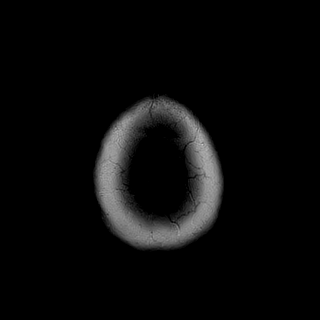

[FLAIR · axial · 5.0mm · 0.43mm/px · z∈[-107,+13]mm · 2 of 25 slices shown]
[im 1/25]
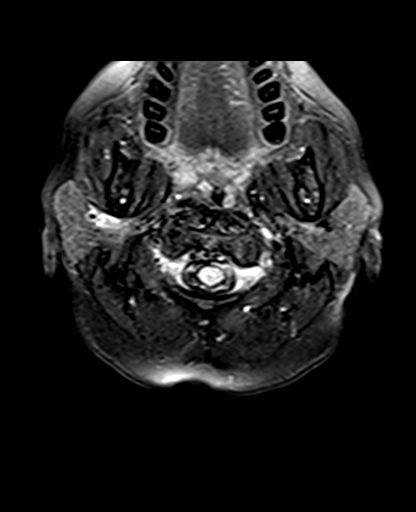
[im 25/25]
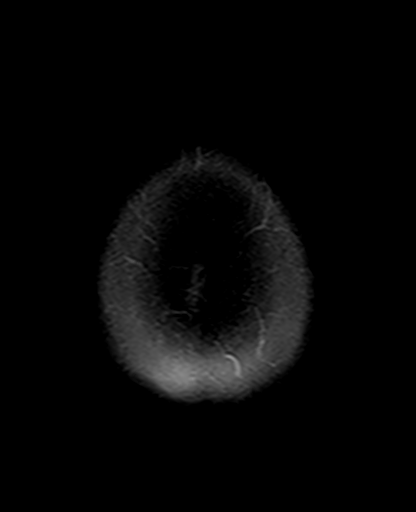

[swi_images · axial · 3.0mm · 0.86mm/px · z∈[-112,+35]mm · 6 of 60 slices shown]
[im 1/60]
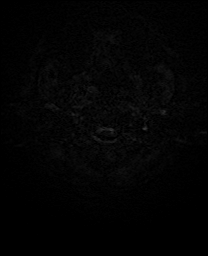
[im 12/60]
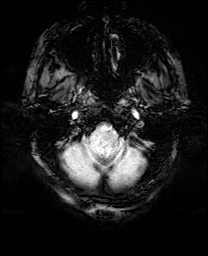
[im 24/60]
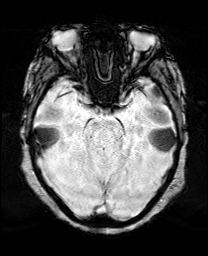
[im 36/60]
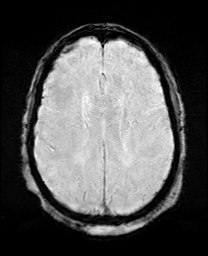
[im 48/60]
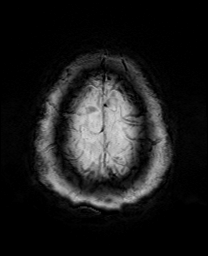
[im 60/60]
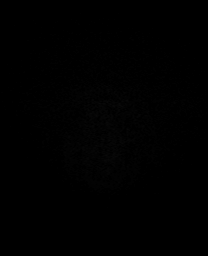

[mip_images(sw) · axial · 24.0mm · 0.86mm/px · z∈[-103,+26]mm · 5 of 53 slices shown]
[im 1/53]
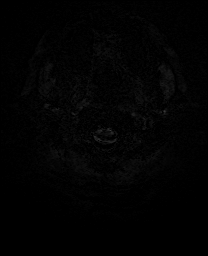
[im 14/53]
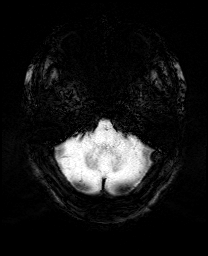
[im 27/53]
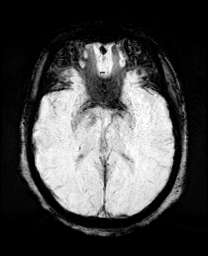
[im 40/53]
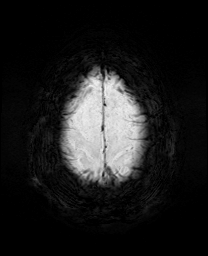
[im 53/53]
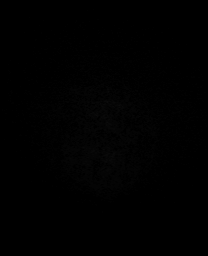

[T2 · coronal · 5.0mm · 0.34mm/px · 3 of 27 slices shown (2 of 2)]
[im 1/27]
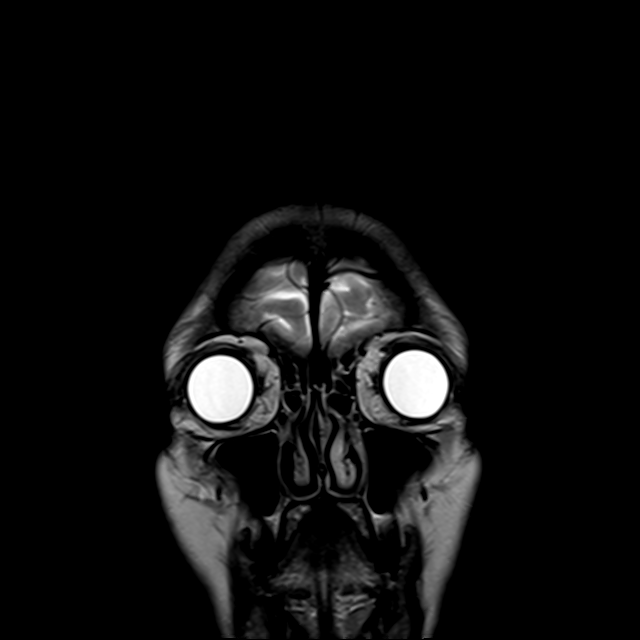
[im 14/27]
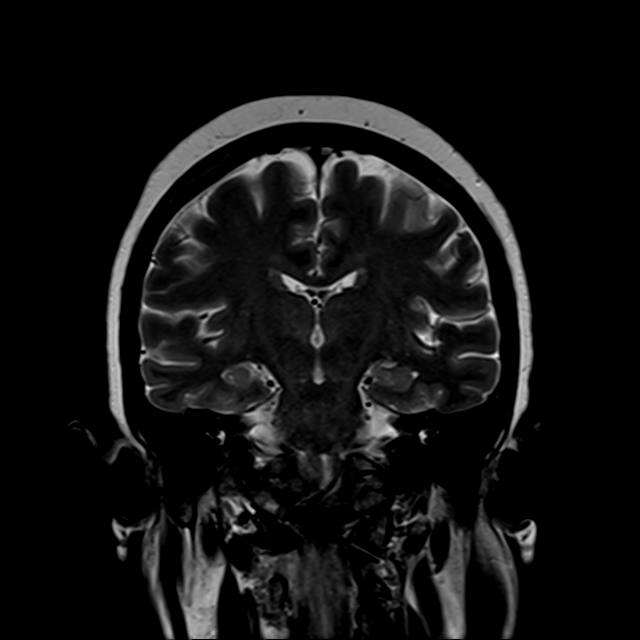
[im 27/27]
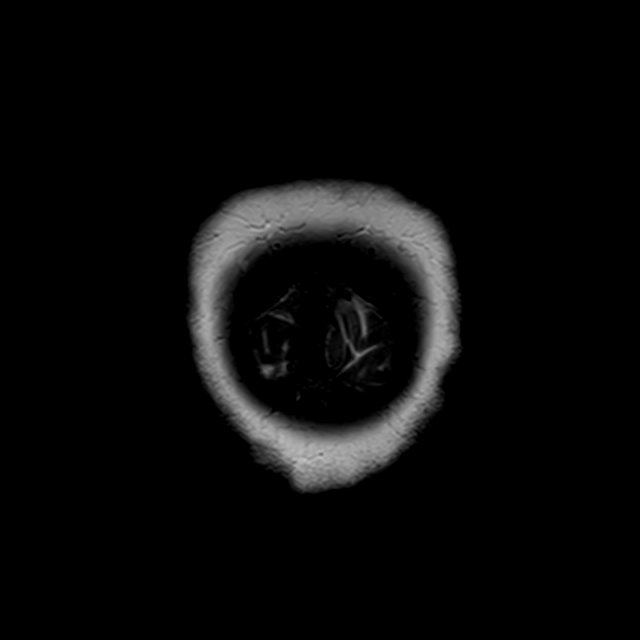

[42 of 48 positions shown; findings below may reference images not displayed]

FINDINGS: Brain: Cerebral volume within normal limits for age. Minimal patchy
T2/FLAIR hyperintensity within the periventricular and deep white
matter both cerebral hemispheres, nonspecific, but most like related
to chronic small vessel ischemic changes, felt to be within normal
limits for age.

No abnormal foci of restricted diffusion to suggest acute or
subacute ischemia. Gray-white matter differentiation maintained. No
encephalomalacia to suggest chronic infarction. No foci of
susceptibility artifact to suggest acute or chronic intracranial
hemorrhage.

No mass lesion, midline shift or mass effect. No hydrocephalus. No
extra-axial fluid collection. Major dural sinuses are grossly
patent.

Pituitary gland suprasellar region normal. Midline structures intact
and normal.

Vascular: Major intracranial vascular flow voids are well
maintained.

Skull and upper cervical spine: Craniocervical junction within
normal limits. Upper cervical spine normal. Bone marrow signal
intensity within normal limits. No scalp soft tissue abnormality.

Sinuses/Orbits: Globes and orbital soft tissues within normal
limits. Patient status post lens extraction bilaterally. Paranasal
sinuses are largely clear. No mastoid effusion. Inner ear structures
normal.

Other: None.
IMPRESSION: Normal brain MRI for age. No acute intracranial abnormality
identified.

## 2018-10-22 DIAGNOSIS — E1169 Type 2 diabetes mellitus with other specified complication: Secondary | ICD-10-CM | POA: Diagnosis not present

## 2018-10-22 DIAGNOSIS — R42 Dizziness and giddiness: Secondary | ICD-10-CM | POA: Diagnosis not present

## 2019-05-01 DIAGNOSIS — Z23 Encounter for immunization: Secondary | ICD-10-CM | POA: Diagnosis not present

## 2019-05-10 DIAGNOSIS — Z961 Presence of intraocular lens: Secondary | ICD-10-CM | POA: Diagnosis not present

## 2019-05-10 DIAGNOSIS — E119 Type 2 diabetes mellitus without complications: Secondary | ICD-10-CM | POA: Diagnosis not present

## 2019-05-10 DIAGNOSIS — H5213 Myopia, bilateral: Secondary | ICD-10-CM | POA: Diagnosis not present

## 2019-05-10 DIAGNOSIS — H52223 Regular astigmatism, bilateral: Secondary | ICD-10-CM | POA: Diagnosis not present

## 2019-05-10 DIAGNOSIS — H524 Presbyopia: Secondary | ICD-10-CM | POA: Diagnosis not present

## 2019-05-10 DIAGNOSIS — H18419 Arcus senilis, unspecified eye: Secondary | ICD-10-CM | POA: Diagnosis not present

## 2019-05-10 DIAGNOSIS — Z7984 Long term (current) use of oral hypoglycemic drugs: Secondary | ICD-10-CM | POA: Diagnosis not present

## 2019-05-14 DIAGNOSIS — E2839 Other primary ovarian failure: Secondary | ICD-10-CM | POA: Diagnosis not present

## 2019-05-14 DIAGNOSIS — E1169 Type 2 diabetes mellitus with other specified complication: Secondary | ICD-10-CM | POA: Diagnosis not present

## 2019-05-14 DIAGNOSIS — D5720 Sickle-cell/Hb-C disease without crisis: Secondary | ICD-10-CM | POA: Diagnosis not present

## 2019-05-14 DIAGNOSIS — Z1389 Encounter for screening for other disorder: Secondary | ICD-10-CM | POA: Diagnosis not present

## 2019-05-14 DIAGNOSIS — Z0000 Encounter for general adult medical examination without abnormal findings: Secondary | ICD-10-CM | POA: Diagnosis not present

## 2019-05-14 DIAGNOSIS — M109 Gout, unspecified: Secondary | ICD-10-CM | POA: Diagnosis not present

## 2019-05-14 DIAGNOSIS — Z1211 Encounter for screening for malignant neoplasm of colon: Secondary | ICD-10-CM | POA: Diagnosis not present

## 2019-05-14 DIAGNOSIS — H9209 Otalgia, unspecified ear: Secondary | ICD-10-CM | POA: Diagnosis not present

## 2019-06-04 ENCOUNTER — Other Ambulatory Visit: Payer: Self-pay | Admitting: Internal Medicine

## 2019-06-04 DIAGNOSIS — E2839 Other primary ovarian failure: Secondary | ICD-10-CM

## 2019-06-04 DIAGNOSIS — Z1231 Encounter for screening mammogram for malignant neoplasm of breast: Secondary | ICD-10-CM

## 2019-07-08 DIAGNOSIS — K5730 Diverticulosis of large intestine without perforation or abscess without bleeding: Secondary | ICD-10-CM | POA: Diagnosis not present

## 2019-07-08 DIAGNOSIS — Z86010 Personal history of colonic polyps: Secondary | ICD-10-CM | POA: Diagnosis not present

## 2019-08-06 DIAGNOSIS — I10 Essential (primary) hypertension: Secondary | ICD-10-CM | POA: Diagnosis not present

## 2019-08-06 DIAGNOSIS — E1169 Type 2 diabetes mellitus with other specified complication: Secondary | ICD-10-CM | POA: Diagnosis not present

## 2019-08-06 DIAGNOSIS — E1165 Type 2 diabetes mellitus with hyperglycemia: Secondary | ICD-10-CM | POA: Diagnosis not present

## 2019-08-13 DIAGNOSIS — Z1159 Encounter for screening for other viral diseases: Secondary | ICD-10-CM | POA: Diagnosis not present

## 2019-08-16 DIAGNOSIS — K5730 Diverticulosis of large intestine without perforation or abscess without bleeding: Secondary | ICD-10-CM | POA: Diagnosis not present

## 2019-08-16 DIAGNOSIS — Z86010 Personal history of colonic polyps: Secondary | ICD-10-CM | POA: Diagnosis not present

## 2019-09-28 ENCOUNTER — Ambulatory Visit: Payer: Medicare HMO | Attending: Internal Medicine

## 2019-09-28 DIAGNOSIS — Z23 Encounter for immunization: Secondary | ICD-10-CM

## 2019-09-28 NOTE — Progress Notes (Signed)
   Covid-19 Vaccination Clinic  Name:  Judy Vargas    MRN: [id number] DOB: 05-15-1946  09/28/2019  [name] was observed post Covid-19 immunization for 15 minutes without incidence. She was provided with Vaccine Information Sheet and instruction to access the V-Safe system.   [name] was instructed to call 911 with any severe reactions post vaccine: Marland Kitchen Difficulty breathing  . Swelling of your face and throat  . A fast heartbeat  . A bad rash all over your body  . Dizziness and weakness    Immunizations Administered    Name Date Dose VIS Date Route   Pfizer COVID-19 Vaccine 09/28/2019 10:08 AM 0.3 mL 07/19/2019 Intramuscular   Manufacturer: [employer], [employer]   Lot: [id number]   NDC: [address]-7

## 2019-10-22 ENCOUNTER — Ambulatory Visit: Payer: Medicare HMO | Attending: Internal Medicine

## 2019-10-22 DIAGNOSIS — Z23 Encounter for immunization: Secondary | ICD-10-CM

## 2019-10-22 NOTE — Progress Notes (Signed)
   Covid-19 Vaccination Clinic  Name:  MONICIA TSE    MRN: [id number] DOB: [birth year]  10/22/2019  Ms. Speich was observed post Covid-19 immunization for 15 minutes without incident. She was provided with Vaccine Information Sheet and instruction to access the V-Safe system.   Ms. Kitzmiller was instructed to call 911 with any severe reactions post vaccine: Marland Kitchen Difficulty breathing  . Swelling of face and throat  . A fast heartbeat  . A bad rash all over body  . Dizziness and weakness   Immunizations Administered    Name Date Dose VIS Date Route   Pfizer COVID-19 Vaccine 10/22/2019 11:22 AM 0.3 mL 07/19/2019 Intramuscular   Manufacturer: [employer], [employer]   Lot: [id number]   NDC: [address]-2

## 2019-10-30 DIAGNOSIS — E1169 Type 2 diabetes mellitus with other specified complication: Secondary | ICD-10-CM | POA: Diagnosis not present

## 2019-10-30 DIAGNOSIS — I10 Essential (primary) hypertension: Secondary | ICD-10-CM | POA: Diagnosis not present

## 2019-10-30 DIAGNOSIS — E1165 Type 2 diabetes mellitus with hyperglycemia: Secondary | ICD-10-CM | POA: Diagnosis not present

## 2019-11-14 DIAGNOSIS — E1169 Type 2 diabetes mellitus with other specified complication: Secondary | ICD-10-CM | POA: Diagnosis not present

## 2019-11-14 DIAGNOSIS — I10 Essential (primary) hypertension: Secondary | ICD-10-CM | POA: Diagnosis not present

## 2019-11-14 DIAGNOSIS — E663 Overweight: Secondary | ICD-10-CM | POA: Diagnosis not present

## 2019-11-14 DIAGNOSIS — M109 Gout, unspecified: Secondary | ICD-10-CM | POA: Diagnosis not present

## 2019-11-21 ENCOUNTER — Ambulatory Visit
Admission: RE | Admit: 2019-11-21 | Discharge: 2019-11-21 | Disposition: A | Discharge status: Home / Self Care | Payer: Medicare HMO | Source: Ambulatory Visit | Attending: Internal Medicine | Admitting: Internal Medicine

## 2019-11-21 ENCOUNTER — Other Ambulatory Visit: Payer: Self-pay

## 2019-11-21 DIAGNOSIS — E2839 Other primary ovarian failure: Secondary | ICD-10-CM

## 2019-11-21 DIAGNOSIS — Z1231 Encounter for screening mammogram for malignant neoplasm of breast: Secondary | ICD-10-CM

## 2019-11-21 DIAGNOSIS — Z780 Asymptomatic menopausal state: Secondary | ICD-10-CM | POA: Diagnosis not present

## 2020-01-20 ENCOUNTER — Ambulatory Visit
Admission: RE | Admit: 2020-01-20 | Discharge: 2020-01-20 | Disposition: A | Discharge status: Home / Self Care | Payer: Medicare HMO | Source: Ambulatory Visit | Attending: Internal Medicine | Admitting: Internal Medicine

## 2020-01-20 ENCOUNTER — Other Ambulatory Visit: Payer: Self-pay | Admitting: Internal Medicine

## 2020-01-20 DIAGNOSIS — M25511 Pain in right shoulder: Secondary | ICD-10-CM | POA: Diagnosis not present

## 2020-04-07 DIAGNOSIS — I10 Essential (primary) hypertension: Secondary | ICD-10-CM | POA: Diagnosis not present

## 2020-04-07 DIAGNOSIS — E1169 Type 2 diabetes mellitus with other specified complication: Secondary | ICD-10-CM | POA: Diagnosis not present

## 2020-04-07 DIAGNOSIS — E1165 Type 2 diabetes mellitus with hyperglycemia: Secondary | ICD-10-CM | POA: Diagnosis not present

## 2020-04-27 DIAGNOSIS — H04123 Dry eye syndrome of bilateral lacrimal glands: Secondary | ICD-10-CM | POA: Diagnosis not present

## 2020-04-27 DIAGNOSIS — Z961 Presence of intraocular lens: Secondary | ICD-10-CM | POA: Diagnosis not present

## 2020-05-19 DIAGNOSIS — H524 Presbyopia: Secondary | ICD-10-CM | POA: Diagnosis not present

## 2020-05-19 DIAGNOSIS — Z7984 Long term (current) use of oral hypoglycemic drugs: Secondary | ICD-10-CM | POA: Diagnosis not present

## 2020-05-19 DIAGNOSIS — H52223 Regular astigmatism, bilateral: Secondary | ICD-10-CM | POA: Diagnosis not present

## 2020-05-19 DIAGNOSIS — Z794 Long term (current) use of insulin: Secondary | ICD-10-CM | POA: Diagnosis not present

## 2020-05-19 DIAGNOSIS — H5213 Myopia, bilateral: Secondary | ICD-10-CM | POA: Diagnosis not present

## 2020-05-19 DIAGNOSIS — E119 Type 2 diabetes mellitus without complications: Secondary | ICD-10-CM | POA: Diagnosis not present

## 2020-05-19 DIAGNOSIS — Z961 Presence of intraocular lens: Secondary | ICD-10-CM | POA: Diagnosis not present

## 2020-05-19 DIAGNOSIS — H18419 Arcus senilis, unspecified eye: Secondary | ICD-10-CM | POA: Diagnosis not present

## 2020-06-03 DIAGNOSIS — Z23 Encounter for immunization: Secondary | ICD-10-CM | POA: Diagnosis not present

## 2020-06-11 DIAGNOSIS — Z0000 Encounter for general adult medical examination without abnormal findings: Secondary | ICD-10-CM | POA: Diagnosis not present

## 2020-06-11 DIAGNOSIS — Z7984 Long term (current) use of oral hypoglycemic drugs: Secondary | ICD-10-CM | POA: Diagnosis not present

## 2020-06-11 DIAGNOSIS — E1169 Type 2 diabetes mellitus with other specified complication: Secondary | ICD-10-CM | POA: Diagnosis not present

## 2020-06-11 DIAGNOSIS — K219 Gastro-esophageal reflux disease without esophagitis: Secondary | ICD-10-CM | POA: Diagnosis not present

## 2020-06-11 DIAGNOSIS — Z1389 Encounter for screening for other disorder: Secondary | ICD-10-CM | POA: Diagnosis not present

## 2020-06-11 DIAGNOSIS — Z13220 Encounter for screening for lipoid disorders: Secondary | ICD-10-CM | POA: Diagnosis not present

## 2020-06-11 DIAGNOSIS — E663 Overweight: Secondary | ICD-10-CM | POA: Diagnosis not present

## 2020-06-11 DIAGNOSIS — M109 Gout, unspecified: Secondary | ICD-10-CM | POA: Diagnosis not present

## 2020-08-28 DIAGNOSIS — K219 Gastro-esophageal reflux disease without esophagitis: Secondary | ICD-10-CM | POA: Diagnosis not present

## 2020-08-28 DIAGNOSIS — I10 Essential (primary) hypertension: Secondary | ICD-10-CM | POA: Diagnosis not present

## 2020-08-28 DIAGNOSIS — E1165 Type 2 diabetes mellitus with hyperglycemia: Secondary | ICD-10-CM | POA: Diagnosis not present

## 2020-08-28 DIAGNOSIS — E1169 Type 2 diabetes mellitus with other specified complication: Secondary | ICD-10-CM | POA: Diagnosis not present

## 2020-12-17 DIAGNOSIS — Z5181 Encounter for therapeutic drug level monitoring: Secondary | ICD-10-CM | POA: Diagnosis not present

## 2020-12-17 DIAGNOSIS — M109 Gout, unspecified: Secondary | ICD-10-CM | POA: Diagnosis not present

## 2020-12-17 DIAGNOSIS — M79671 Pain in right foot: Secondary | ICD-10-CM | POA: Diagnosis not present

## 2020-12-17 DIAGNOSIS — E1169 Type 2 diabetes mellitus with other specified complication: Secondary | ICD-10-CM | POA: Diagnosis not present

## 2020-12-17 DIAGNOSIS — I10 Essential (primary) hypertension: Secondary | ICD-10-CM | POA: Diagnosis not present

## 2021-01-07 ENCOUNTER — Other Ambulatory Visit: Payer: Self-pay

## 2021-01-07 ENCOUNTER — Ambulatory Visit (INDEPENDENT_AMBULATORY_CARE_PROVIDER_SITE_OTHER): Payer: Medicare HMO | Admitting: Sports Medicine

## 2021-01-07 ENCOUNTER — Encounter: Payer: Self-pay | Admitting: Sports Medicine

## 2021-01-07 ENCOUNTER — Ambulatory Visit (INDEPENDENT_AMBULATORY_CARE_PROVIDER_SITE_OTHER): Payer: Medicare HMO

## 2021-01-07 ENCOUNTER — Other Ambulatory Visit: Payer: Self-pay | Admitting: Sports Medicine

## 2021-01-07 DIAGNOSIS — M25571 Pain in right ankle and joints of right foot: Secondary | ICD-10-CM | POA: Diagnosis not present

## 2021-01-07 DIAGNOSIS — M79671 Pain in right foot: Secondary | ICD-10-CM

## 2021-01-07 DIAGNOSIS — K5732 Diverticulitis of large intestine without perforation or abscess without bleeding: Secondary | ICD-10-CM | POA: Insufficient documentation

## 2021-01-07 DIAGNOSIS — R920 Mammographic microcalcification found on diagnostic imaging of breast: Secondary | ICD-10-CM | POA: Insufficient documentation

## 2021-01-07 DIAGNOSIS — Z860100 Personal history of colon polyps, unspecified: Secondary | ICD-10-CM | POA: Insufficient documentation

## 2021-01-07 DIAGNOSIS — E1169 Type 2 diabetes mellitus with other specified complication: Secondary | ICD-10-CM | POA: Insufficient documentation

## 2021-01-07 DIAGNOSIS — I10 Essential (primary) hypertension: Secondary | ICD-10-CM | POA: Insufficient documentation

## 2021-01-07 DIAGNOSIS — E1165 Type 2 diabetes mellitus with hyperglycemia: Secondary | ICD-10-CM | POA: Insufficient documentation

## 2021-01-07 DIAGNOSIS — M109 Gout, unspecified: Secondary | ICD-10-CM | POA: Insufficient documentation

## 2021-01-07 DIAGNOSIS — K219 Gastro-esophageal reflux disease without esophagitis: Secondary | ICD-10-CM | POA: Insufficient documentation

## 2021-01-07 DIAGNOSIS — Q899 Congenital malformation, unspecified: Secondary | ICD-10-CM | POA: Insufficient documentation

## 2021-01-07 DIAGNOSIS — M779 Enthesopathy, unspecified: Secondary | ICD-10-CM

## 2021-01-07 DIAGNOSIS — K5730 Diverticulosis of large intestine without perforation or abscess without bleeding: Secondary | ICD-10-CM | POA: Insufficient documentation

## 2021-01-07 DIAGNOSIS — E1121 Type 2 diabetes mellitus with diabetic nephropathy: Secondary | ICD-10-CM | POA: Insufficient documentation

## 2021-01-07 DIAGNOSIS — E2839 Other primary ovarian failure: Secondary | ICD-10-CM | POA: Insufficient documentation

## 2021-01-07 NOTE — Progress Notes (Signed)
Subjective: Judy Vargas is a 75 y.o. female patient who presents to office for evaluation of Right heel pain since March.  Patient reports that it is constant pain and swelling throbbing through the lateral side of her ankle and the back of her heel denies any known history of injury reports that she has saw her PCP and has done elevation ice and taking Aleve but it still continues to hurt she even has pain now with sitting however pain does worsen with walking and activity.  Patient denies any other pedal complaints at this time.  Review of Systems  Musculoskeletal: Positive for joint pain.  All other systems reviewed and are negative.    Patient Active Problem List   Diagnosis Date Noted  . Congenital malformation 01/07/2021  . Decreased estrogen level 01/07/2021  . Diabetic renal disease (HCC) 01/07/2021  . Diverticulitis of colon 01/07/2021  . Diverticulosis of colon 01/07/2021  . Essential hypertension 01/07/2021  . Gastro-esophageal reflux disease without esophagitis 01/07/2021  . Gout 01/07/2021  . Hyperglycemia due to type 2 diabetes mellitus (HCC) 01/07/2021  . Mammographic microcalcification found on diagnostic imaging of breast 01/07/2021  . Personal history of colonic polyps 01/07/2021  . Type 2 diabetes mellitus with other specified complication (HCC) 01/07/2021    Current Outpatient Medications on File Prior to Visit  Medication Sig Dispense Refill  . losartan (COZAAR) 25 MG tablet 1 tablet    . meloxicam (MOBIC) 15 MG tablet 1 tablet    . acetaminophen (TYLENOL) 500 MG tablet Take 500 mg by mouth every 6 (six) hours as needed for mild pain.    Marland Kitchen allopurinol (ZYLOPRIM) 100 MG tablet Take 100 mg by mouth daily.    . cefdinir (OMNICEF) 300 MG capsule Take 1 capsule (300 mg total) by mouth 2 (two) times daily. 20 capsule 0  . CINNAMON PO Take 1 tablet by mouth daily.    . ciprofloxacin (CIPRO) 500 MG tablet Take 1 tablet (500 mg total) by mouth 2 (two) times daily.  (Patient not taking: Reported on 11/23/2017) 20 tablet 0  . glipiZIDE (GLUCOTROL) 10 MG tablet Take 10 mg by mouth every evening.    Marland Kitchen HYDROcodone-acetaminophen (NORCO/VICODIN) 5-325 MG tablet Take 1-2 tablets by mouth every 6 (six) hours as needed. (Patient not taking: Reported on 11/23/2017) 20 tablet 0  . JANUVIA 50 MG tablet     . meclizine (ANTIVERT) 25 MG tablet Take 1 tablet (25 mg total) by mouth 3 (three) times daily as needed for dizziness. 30 tablet 0  . metroNIDAZOLE (FLAGYL) 500 MG tablet Take 1 tablet (500 mg total) by mouth 3 (three) times daily. (Patient not taking: Reported on 11/23/2017) 30 tablet 0  . naproxen sodium (ALEVE) 220 MG tablet 1 tablet with food or milk as needed    . ondansetron (ZOFRAN ODT) 4 MG disintegrating tablet Take 1 tablet (4 mg total) by mouth every 8 (eight) hours as needed for nausea or vomiting. (Patient not taking: Reported on 11/23/2017) 20 tablet 0  . ranitidine (ZANTAC) 150 MG tablet Take 150 mg by mouth daily as needed for heartburn.     No current facility-administered medications on file prior to visit.    Allergies  Allergen Reactions  . Asa [Aspirin] Hives  . Famotidine     Other reaction(s): palpitations  . Penicillins Swelling    Objective:  General: Alert and oriented x3 in no acute distress  Dermatology: No open lesions bilateral lower extremities, no webspace macerations, no ecchymosis bilateral,  all nails x 10 are well manicured.  Vascular: Dorsalis Pedis and Posterior Tibial pedal pulses 1/4, Capillary Fill Time 3 seconds, + pedal hair growth bilateral, no edema bilateral lower extremities, Temperature gradient within normal limits.  Neurology: Michaell Cowing sensation intact via light touch bilateral.  Musculoskeletal: Mild tenderness with palpation at insertion of the Achilles on Right, there is also tenderness along the peroneal tendon course at the lateral ankle with localized soft tissue swelling.  To the right posterior heel there is  calcaneal exostosis with mild soft tissue present and decreased ankle rom with knee extending  vs flexed resembling gastroc equnius bilateral, The achilles tendon feels intact with no nodularity or palpable dell, Thompson sign negative. Gait: Antalgic gait with increased heel off right>left.   Xrays  Right foot and ankle   Impression: Normal osseous mineralization. Joint spaces preserved except left ankle with some early changes of arthritis. No fracture/dislocation/boney destruction. Calcaneal spur present. Kager's triangle intact with no obliteration. No soft tissue abnormalities or radiopaque foreign bodies.   Assessment and Plan: Problem List Items Addressed This Visit   None   Visit Diagnoses    Right ankle pain, unspecified chronicity    -  Primary   Relevant Orders   DG Ankle 2 Views Right   Pain of right heel       Relevant Orders   DG Foot Complete Right   Tendonitis       Relevant Orders   MR ANKLE RIGHT WO CONTRAST      -Complete examination performed -Xrays reviewed -Discussed treatement options for tendinitis at the Achilles and possible concern for tendinitis versus tear at the peroneal tendon -Dispensed a CAM boot for patient to use during the day to help keep the stress off the tendons at the back of her right heel -Dispensed a night splint for patient to use at bedtime to offer some protection and gentle stretching when sleeping -Advised patient to ice 20 minutes in the evening -May continue with Aleve as needed for pain -Ordered MRI to further rule out tear since symptoms have been going on since March at the right and not improving -Patient to return to office after MRI or sooner if condition worsens.  Asencion Islam, DPM

## 2021-01-27 ENCOUNTER — Ambulatory Visit
Admission: RE | Admit: 2021-01-27 | Discharge: 2021-01-27 | Disposition: A | Discharge status: Home / Self Care | Payer: Medicare HMO | Source: Ambulatory Visit | Attending: Sports Medicine | Admitting: Sports Medicine

## 2021-01-27 DIAGNOSIS — R600 Localized edema: Secondary | ICD-10-CM | POA: Diagnosis not present

## 2021-01-27 DIAGNOSIS — M779 Enthesopathy, unspecified: Secondary | ICD-10-CM

## 2021-03-04 DIAGNOSIS — E1165 Type 2 diabetes mellitus with hyperglycemia: Secondary | ICD-10-CM | POA: Diagnosis not present

## 2021-03-04 DIAGNOSIS — K219 Gastro-esophageal reflux disease without esophagitis: Secondary | ICD-10-CM | POA: Diagnosis not present

## 2021-03-04 DIAGNOSIS — E1169 Type 2 diabetes mellitus with other specified complication: Secondary | ICD-10-CM | POA: Diagnosis not present

## 2021-03-04 DIAGNOSIS — I10 Essential (primary) hypertension: Secondary | ICD-10-CM | POA: Diagnosis not present

## 2021-06-16 DIAGNOSIS — I10 Essential (primary) hypertension: Secondary | ICD-10-CM | POA: Diagnosis not present

## 2021-06-16 DIAGNOSIS — E1169 Type 2 diabetes mellitus with other specified complication: Secondary | ICD-10-CM | POA: Diagnosis not present

## 2021-06-16 DIAGNOSIS — M109 Gout, unspecified: Secondary | ICD-10-CM | POA: Diagnosis not present

## 2021-06-16 DIAGNOSIS — D649 Anemia, unspecified: Secondary | ICD-10-CM | POA: Diagnosis not present

## 2021-06-16 DIAGNOSIS — Z0000 Encounter for general adult medical examination without abnormal findings: Secondary | ICD-10-CM | POA: Diagnosis not present

## 2021-06-16 DIAGNOSIS — Z79899 Other long term (current) drug therapy: Secondary | ICD-10-CM | POA: Diagnosis not present

## 2021-06-16 DIAGNOSIS — Z7984 Long term (current) use of oral hypoglycemic drugs: Secondary | ICD-10-CM | POA: Diagnosis not present

## 2021-06-24 DIAGNOSIS — D649 Anemia, unspecified: Secondary | ICD-10-CM | POA: Diagnosis not present

## 2021-06-25 DIAGNOSIS — E1165 Type 2 diabetes mellitus with hyperglycemia: Secondary | ICD-10-CM | POA: Diagnosis not present

## 2021-06-25 DIAGNOSIS — I10 Essential (primary) hypertension: Secondary | ICD-10-CM | POA: Diagnosis not present

## 2021-06-25 DIAGNOSIS — K219 Gastro-esophageal reflux disease without esophagitis: Secondary | ICD-10-CM | POA: Diagnosis not present

## 2021-06-25 DIAGNOSIS — E1169 Type 2 diabetes mellitus with other specified complication: Secondary | ICD-10-CM | POA: Diagnosis not present

## 2021-11-05 DIAGNOSIS — I10 Essential (primary) hypertension: Secondary | ICD-10-CM | POA: Diagnosis not present

## 2021-11-05 DIAGNOSIS — E1165 Type 2 diabetes mellitus with hyperglycemia: Secondary | ICD-10-CM | POA: Diagnosis not present

## 2021-12-15 DIAGNOSIS — E1169 Type 2 diabetes mellitus with other specified complication: Secondary | ICD-10-CM | POA: Diagnosis not present

## 2021-12-15 DIAGNOSIS — D5740 Sickle-cell thalassemia without crisis: Secondary | ICD-10-CM | POA: Diagnosis not present

## 2021-12-15 DIAGNOSIS — I10 Essential (primary) hypertension: Secondary | ICD-10-CM | POA: Diagnosis not present

## 2021-12-15 DIAGNOSIS — M109 Gout, unspecified: Secondary | ICD-10-CM | POA: Diagnosis not present

## 2022-01-17 DIAGNOSIS — E119 Type 2 diabetes mellitus without complications: Secondary | ICD-10-CM | POA: Diagnosis not present

## 2022-01-17 DIAGNOSIS — H5210 Myopia, unspecified eye: Secondary | ICD-10-CM | POA: Diagnosis not present

## 2022-01-17 DIAGNOSIS — Z0100 Encounter for examination of eyes and vision without abnormal findings: Secondary | ICD-10-CM | POA: Diagnosis not present

## 2022-03-18 ENCOUNTER — Other Ambulatory Visit: Payer: Self-pay | Admitting: Internal Medicine

## 2022-03-18 DIAGNOSIS — R112 Nausea with vomiting, unspecified: Secondary | ICD-10-CM | POA: Diagnosis not present

## 2022-03-18 DIAGNOSIS — R5383 Other fatigue: Secondary | ICD-10-CM | POA: Diagnosis not present

## 2022-03-18 DIAGNOSIS — E1169 Type 2 diabetes mellitus with other specified complication: Secondary | ICD-10-CM | POA: Diagnosis not present

## 2022-03-18 DIAGNOSIS — R002 Palpitations: Secondary | ICD-10-CM | POA: Diagnosis not present

## 2022-03-18 DIAGNOSIS — R55 Syncope and collapse: Secondary | ICD-10-CM | POA: Diagnosis not present

## 2022-03-18 DIAGNOSIS — R1013 Epigastric pain: Secondary | ICD-10-CM

## 2022-03-18 DIAGNOSIS — K30 Functional dyspepsia: Secondary | ICD-10-CM | POA: Diagnosis not present

## 2022-03-22 ENCOUNTER — Ambulatory Visit
Admission: RE | Admit: 2022-03-22 | Discharge: 2022-03-22 | Disposition: A | Discharge status: Home / Self Care | Payer: Medicare HMO | Source: Ambulatory Visit | Attending: Internal Medicine | Admitting: Internal Medicine

## 2022-03-22 DIAGNOSIS — K219 Gastro-esophageal reflux disease without esophagitis: Secondary | ICD-10-CM | POA: Diagnosis not present

## 2022-03-22 DIAGNOSIS — R1013 Epigastric pain: Secondary | ICD-10-CM

## 2022-03-22 DIAGNOSIS — K224 Dyskinesia of esophagus: Secondary | ICD-10-CM | POA: Diagnosis not present

## 2022-03-22 DIAGNOSIS — R1310 Dysphagia, unspecified: Secondary | ICD-10-CM | POA: Diagnosis not present

## 2022-03-22 DIAGNOSIS — K449 Diaphragmatic hernia without obstruction or gangrene: Secondary | ICD-10-CM | POA: Diagnosis not present

## 2022-04-27 DIAGNOSIS — H9202 Otalgia, left ear: Secondary | ICD-10-CM | POA: Diagnosis not present

## 2022-05-21 DIAGNOSIS — Z23 Encounter for immunization: Secondary | ICD-10-CM | POA: Diagnosis not present

## 2022-07-13 DIAGNOSIS — I10 Essential (primary) hypertension: Secondary | ICD-10-CM | POA: Diagnosis not present

## 2022-07-13 DIAGNOSIS — E1169 Type 2 diabetes mellitus with other specified complication: Secondary | ICD-10-CM | POA: Diagnosis not present

## 2022-07-13 DIAGNOSIS — Z0000 Encounter for general adult medical examination without abnormal findings: Secondary | ICD-10-CM | POA: Diagnosis not present

## 2022-07-13 DIAGNOSIS — D563 Thalassemia minor: Secondary | ICD-10-CM | POA: Diagnosis not present

## 2022-07-13 DIAGNOSIS — Z1331 Encounter for screening for depression: Secondary | ICD-10-CM | POA: Diagnosis not present

## 2022-07-13 DIAGNOSIS — M109 Gout, unspecified: Secondary | ICD-10-CM | POA: Diagnosis not present

## 2022-09-13 DIAGNOSIS — M25552 Pain in left hip: Secondary | ICD-10-CM | POA: Diagnosis not present

## 2022-09-13 DIAGNOSIS — M542 Cervicalgia: Secondary | ICD-10-CM | POA: Diagnosis not present

## 2022-09-27 DIAGNOSIS — K449 Diaphragmatic hernia without obstruction or gangrene: Secondary | ICD-10-CM | POA: Diagnosis not present

## 2022-09-27 DIAGNOSIS — K219 Gastro-esophageal reflux disease without esophagitis: Secondary | ICD-10-CM | POA: Diagnosis not present

## 2022-09-27 DIAGNOSIS — K224 Dyskinesia of esophagus: Secondary | ICD-10-CM | POA: Diagnosis not present

## 2022-11-14 DIAGNOSIS — Z86010 Personal history of colonic polyps: Secondary | ICD-10-CM | POA: Diagnosis not present

## 2022-11-14 DIAGNOSIS — K219 Gastro-esophageal reflux disease without esophagitis: Secondary | ICD-10-CM | POA: Diagnosis not present

## 2022-11-14 DIAGNOSIS — K639 Disease of intestine, unspecified: Secondary | ICD-10-CM | POA: Diagnosis not present

## 2023-02-06 DIAGNOSIS — H04123 Dry eye syndrome of bilateral lacrimal glands: Secondary | ICD-10-CM | POA: Diagnosis not present

## 2023-02-06 DIAGNOSIS — H5213 Myopia, bilateral: Secondary | ICD-10-CM | POA: Diagnosis not present

## 2023-02-06 DIAGNOSIS — E119 Type 2 diabetes mellitus without complications: Secondary | ICD-10-CM | POA: Diagnosis not present

## 2023-02-06 DIAGNOSIS — Z961 Presence of intraocular lens: Secondary | ICD-10-CM | POA: Diagnosis not present

## 2023-02-06 DIAGNOSIS — Z135 Encounter for screening for eye and ear disorders: Secondary | ICD-10-CM | POA: Diagnosis not present

## 2023-02-23 DIAGNOSIS — M109 Gout, unspecified: Secondary | ICD-10-CM | POA: Diagnosis not present

## 2023-02-23 DIAGNOSIS — E1122 Type 2 diabetes mellitus with diabetic chronic kidney disease: Secondary | ICD-10-CM | POA: Diagnosis not present

## 2023-02-23 DIAGNOSIS — I10 Essential (primary) hypertension: Secondary | ICD-10-CM | POA: Diagnosis not present

## 2023-02-23 DIAGNOSIS — E1165 Type 2 diabetes mellitus with hyperglycemia: Secondary | ICD-10-CM | POA: Diagnosis not present

## 2023-02-23 DIAGNOSIS — E1169 Type 2 diabetes mellitus with other specified complication: Secondary | ICD-10-CM | POA: Diagnosis not present

## 2023-02-23 DIAGNOSIS — D5740 Sickle-cell thalassemia without crisis: Secondary | ICD-10-CM | POA: Diagnosis not present

## 2023-02-23 DIAGNOSIS — M79606 Pain in leg, unspecified: Secondary | ICD-10-CM | POA: Diagnosis not present

## 2023-02-23 DIAGNOSIS — D569 Thalassemia, unspecified: Secondary | ICD-10-CM | POA: Diagnosis not present

## 2023-02-23 DIAGNOSIS — K219 Gastro-esophageal reflux disease without esophagitis: Secondary | ICD-10-CM | POA: Diagnosis not present

## 2023-02-23 DIAGNOSIS — N1831 Chronic kidney disease, stage 3a: Secondary | ICD-10-CM | POA: Diagnosis not present

## 2023-03-29 DIAGNOSIS — M542 Cervicalgia: Secondary | ICD-10-CM | POA: Diagnosis not present

## 2023-05-17 DIAGNOSIS — Z23 Encounter for immunization: Secondary | ICD-10-CM | POA: Diagnosis not present

## 2023-06-21 DIAGNOSIS — M542 Cervicalgia: Secondary | ICD-10-CM | POA: Diagnosis not present

## 2023-06-21 DIAGNOSIS — Z23 Encounter for immunization: Secondary | ICD-10-CM | POA: Diagnosis not present

## 2023-06-26 DIAGNOSIS — M542 Cervicalgia: Secondary | ICD-10-CM | POA: Diagnosis not present

## 2023-06-26 DIAGNOSIS — M47812 Spondylosis without myelopathy or radiculopathy, cervical region: Secondary | ICD-10-CM | POA: Diagnosis not present

## 2023-07-17 DIAGNOSIS — E1165 Type 2 diabetes mellitus with hyperglycemia: Secondary | ICD-10-CM | POA: Diagnosis not present

## 2023-07-17 DIAGNOSIS — E7800 Pure hypercholesterolemia, unspecified: Secondary | ICD-10-CM | POA: Diagnosis not present

## 2023-07-17 DIAGNOSIS — Z9181 History of falling: Secondary | ICD-10-CM | POA: Diagnosis not present

## 2023-07-17 DIAGNOSIS — N1831 Chronic kidney disease, stage 3a: Secondary | ICD-10-CM | POA: Diagnosis not present

## 2023-07-17 DIAGNOSIS — Z0000 Encounter for general adult medical examination without abnormal findings: Secondary | ICD-10-CM | POA: Diagnosis not present

## 2023-07-17 DIAGNOSIS — M542 Cervicalgia: Secondary | ICD-10-CM | POA: Diagnosis not present

## 2023-07-17 DIAGNOSIS — Z23 Encounter for immunization: Secondary | ICD-10-CM | POA: Diagnosis not present

## 2023-07-17 DIAGNOSIS — M109 Gout, unspecified: Secondary | ICD-10-CM | POA: Diagnosis not present

## 2023-07-17 DIAGNOSIS — I10 Essential (primary) hypertension: Secondary | ICD-10-CM | POA: Diagnosis not present

## 2023-07-17 DIAGNOSIS — D5740 Sickle-cell thalassemia without crisis: Secondary | ICD-10-CM | POA: Diagnosis not present

## 2023-07-17 DIAGNOSIS — E1122 Type 2 diabetes mellitus with diabetic chronic kidney disease: Secondary | ICD-10-CM | POA: Diagnosis not present

## 2023-08-11 DIAGNOSIS — M542 Cervicalgia: Secondary | ICD-10-CM | POA: Diagnosis not present

## 2023-08-11 DIAGNOSIS — M5412 Radiculopathy, cervical region: Secondary | ICD-10-CM | POA: Diagnosis not present

## 2023-08-29 DIAGNOSIS — M542 Cervicalgia: Secondary | ICD-10-CM | POA: Diagnosis not present

## 2023-09-05 DIAGNOSIS — M542 Cervicalgia: Secondary | ICD-10-CM | POA: Diagnosis not present

## 2023-09-12 DIAGNOSIS — M542 Cervicalgia: Secondary | ICD-10-CM | POA: Diagnosis not present

## 2023-09-22 DIAGNOSIS — Z03818 Encounter for observation for suspected exposure to other biological agents ruled out: Secondary | ICD-10-CM | POA: Diagnosis not present

## 2023-09-22 DIAGNOSIS — R059 Cough, unspecified: Secondary | ICD-10-CM | POA: Diagnosis not present

## 2023-09-26 DIAGNOSIS — M542 Cervicalgia: Secondary | ICD-10-CM | POA: Diagnosis not present

## 2023-10-10 DIAGNOSIS — M542 Cervicalgia: Secondary | ICD-10-CM | POA: Diagnosis not present

## 2023-10-10 DIAGNOSIS — M5481 Occipital neuralgia: Secondary | ICD-10-CM | POA: Diagnosis not present

## 2023-10-10 DIAGNOSIS — M5412 Radiculopathy, cervical region: Secondary | ICD-10-CM | POA: Diagnosis not present

## 2024-01-22 DIAGNOSIS — M5481 Occipital neuralgia: Secondary | ICD-10-CM | POA: Diagnosis not present

## 2024-01-22 DIAGNOSIS — I10 Essential (primary) hypertension: Secondary | ICD-10-CM | POA: Diagnosis not present

## 2024-01-22 DIAGNOSIS — M109 Gout, unspecified: Secondary | ICD-10-CM | POA: Diagnosis not present

## 2024-01-22 DIAGNOSIS — D5740 Sickle-cell thalassemia without crisis: Secondary | ICD-10-CM | POA: Diagnosis not present

## 2024-01-22 DIAGNOSIS — N1831 Chronic kidney disease, stage 3a: Secondary | ICD-10-CM | POA: Diagnosis not present

## 2024-01-22 DIAGNOSIS — E1122 Type 2 diabetes mellitus with diabetic chronic kidney disease: Secondary | ICD-10-CM | POA: Diagnosis not present

## 2024-02-05 DIAGNOSIS — N1831 Chronic kidney disease, stage 3a: Secondary | ICD-10-CM | POA: Diagnosis not present

## 2024-02-05 DIAGNOSIS — E1165 Type 2 diabetes mellitus with hyperglycemia: Secondary | ICD-10-CM | POA: Diagnosis not present

## 2024-02-05 DIAGNOSIS — E1122 Type 2 diabetes mellitus with diabetic chronic kidney disease: Secondary | ICD-10-CM | POA: Diagnosis not present

## 2024-02-05 DIAGNOSIS — E7800 Pure hypercholesterolemia, unspecified: Secondary | ICD-10-CM | POA: Diagnosis not present

## 2024-02-26 DIAGNOSIS — H5213 Myopia, bilateral: Secondary | ICD-10-CM | POA: Diagnosis not present

## 2024-02-26 DIAGNOSIS — H52223 Regular astigmatism, bilateral: Secondary | ICD-10-CM | POA: Diagnosis not present

## 2024-02-26 DIAGNOSIS — Z961 Presence of intraocular lens: Secondary | ICD-10-CM | POA: Diagnosis not present

## 2024-02-26 DIAGNOSIS — H5712 Ocular pain, left eye: Secondary | ICD-10-CM | POA: Diagnosis not present

## 2024-02-26 DIAGNOSIS — E119 Type 2 diabetes mellitus without complications: Secondary | ICD-10-CM | POA: Diagnosis not present

## 2024-02-26 DIAGNOSIS — H524 Presbyopia: Secondary | ICD-10-CM | POA: Diagnosis not present

## 2024-03-07 DIAGNOSIS — E1165 Type 2 diabetes mellitus with hyperglycemia: Secondary | ICD-10-CM | POA: Diagnosis not present

## 2024-03-07 DIAGNOSIS — N1831 Chronic kidney disease, stage 3a: Secondary | ICD-10-CM | POA: Diagnosis not present

## 2024-03-07 DIAGNOSIS — E7800 Pure hypercholesterolemia, unspecified: Secondary | ICD-10-CM | POA: Diagnosis not present

## 2024-03-07 DIAGNOSIS — E1122 Type 2 diabetes mellitus with diabetic chronic kidney disease: Secondary | ICD-10-CM | POA: Diagnosis not present

## 2024-05-27 DIAGNOSIS — I129 Hypertensive chronic kidney disease with stage 1 through stage 4 chronic kidney disease, or unspecified chronic kidney disease: Secondary | ICD-10-CM | POA: Diagnosis not present

## 2024-05-27 DIAGNOSIS — M109 Gout, unspecified: Secondary | ICD-10-CM | POA: Diagnosis not present

## 2024-05-27 DIAGNOSIS — N189 Chronic kidney disease, unspecified: Secondary | ICD-10-CM | POA: Diagnosis not present

## 2024-05-27 DIAGNOSIS — Z886 Allergy status to analgesic agent status: Secondary | ICD-10-CM | POA: Diagnosis not present

## 2024-05-27 DIAGNOSIS — E1122 Type 2 diabetes mellitus with diabetic chronic kidney disease: Secondary | ICD-10-CM | POA: Diagnosis not present

## 2024-05-27 DIAGNOSIS — K219 Gastro-esophageal reflux disease without esophagitis: Secondary | ICD-10-CM | POA: Diagnosis not present

## 2024-05-27 DIAGNOSIS — Z7984 Long term (current) use of oral hypoglycemic drugs: Secondary | ICD-10-CM | POA: Diagnosis not present

## 2024-05-27 DIAGNOSIS — Z791 Long term (current) use of non-steroidal anti-inflammatories (NSAID): Secondary | ICD-10-CM | POA: Diagnosis not present

## 2024-05-27 DIAGNOSIS — Z5982 Transportation insecurity: Secondary | ICD-10-CM | POA: Diagnosis not present

## 2024-06-05 DIAGNOSIS — G629 Polyneuropathy, unspecified: Secondary | ICD-10-CM | POA: Diagnosis not present

## 2024-06-05 DIAGNOSIS — Z23 Encounter for immunization: Secondary | ICD-10-CM | POA: Diagnosis not present

## 2024-06-05 DIAGNOSIS — R21 Rash and other nonspecific skin eruption: Secondary | ICD-10-CM | POA: Diagnosis not present
